# Patient Record
Sex: Female | Born: 1990 | Race: Black or African American | Hispanic: No | Marital: Single | Smoking: Never smoker
Health system: Southern US, Community
[De-identification: ages and names within clinical notes are randomized; demographics above are authoritative.]

## PROBLEM LIST (undated history)

## (undated) DIAGNOSIS — J302 Other seasonal allergic rhinitis: Secondary | ICD-10-CM

## (undated) DIAGNOSIS — K219 Gastro-esophageal reflux disease without esophagitis: Secondary | ICD-10-CM

## (undated) HISTORY — PX: WISDOM TOOTH EXTRACTION: SHX21

## (undated) HISTORY — PX: APPENDECTOMY: SHX54

---

## 2011-02-27 ENCOUNTER — Ambulatory Visit (HOSPITAL_COMMUNITY)
Admission: EM | Admit: 2011-02-27 | Discharge: 2011-03-01 | Disposition: A | Discharge status: Home / Self Care | Payer: BC Managed Care – PPO | Attending: Surgery | Admitting: Surgery

## 2011-02-27 ENCOUNTER — Emergency Department (HOSPITAL_COMMUNITY): Payer: BC Managed Care – PPO

## 2011-02-27 DIAGNOSIS — L910 Hypertrophic scar: Secondary | ICD-10-CM | POA: Insufficient documentation

## 2011-02-27 DIAGNOSIS — Z01812 Encounter for preprocedural laboratory examination: Secondary | ICD-10-CM | POA: Insufficient documentation

## 2011-02-27 DIAGNOSIS — R112 Nausea with vomiting, unspecified: Secondary | ICD-10-CM | POA: Insufficient documentation

## 2011-02-27 DIAGNOSIS — R109 Unspecified abdominal pain: Secondary | ICD-10-CM | POA: Insufficient documentation

## 2011-02-27 DIAGNOSIS — K3580 Unspecified acute appendicitis: Secondary | ICD-10-CM | POA: Insufficient documentation

## 2011-02-27 DIAGNOSIS — R197 Diarrhea, unspecified: Secondary | ICD-10-CM | POA: Insufficient documentation

## 2011-02-27 LAB — DIFFERENTIAL
Lymphs Abs: 1.9 10*3/uL (ref 0.7–4.0)
Monocytes Absolute: 0.9 10*3/uL (ref 0.1–1.0)
Monocytes Relative: 4 % (ref 3–12)
Neutro Abs: 17.9 10*3/uL — ABNORMAL HIGH (ref 1.7–7.7)
Neutrophils Relative %: 87 % — ABNORMAL HIGH (ref 43–77)

## 2011-02-27 LAB — POCT I-STAT, CHEM 8
BUN: 12 mg/dL (ref 6–23)
Calcium, Ion: 1.16 mmol/L (ref 1.12–1.32)
Chloride: 102 mEq/L (ref 96–112)
Creatinine, Ser: 0.8 mg/dL (ref 0.50–1.10)
Sodium: 139 mEq/L (ref 135–145)

## 2011-02-27 LAB — CBC
MCHC: 35.5 g/dL (ref 30.0–36.0)
Platelets: 300 10*3/uL (ref 150–400)
RDW: 14.2 % (ref 11.5–15.5)
WBC: 20.7 10*3/uL — ABNORMAL HIGH (ref 4.0–10.5)

## 2011-02-28 ENCOUNTER — Emergency Department (HOSPITAL_COMMUNITY): Payer: BC Managed Care – PPO

## 2011-02-28 ENCOUNTER — Other Ambulatory Visit (INDEPENDENT_AMBULATORY_CARE_PROVIDER_SITE_OTHER): Payer: Self-pay | Admitting: Surgery

## 2011-02-28 ENCOUNTER — Other Ambulatory Visit: Payer: Self-pay | Admitting: Emergency Medicine

## 2011-02-28 DIAGNOSIS — K3580 Unspecified acute appendicitis: Secondary | ICD-10-CM

## 2011-02-28 DIAGNOSIS — R1031 Right lower quadrant pain: Secondary | ICD-10-CM

## 2011-02-28 DIAGNOSIS — R112 Nausea with vomiting, unspecified: Secondary | ICD-10-CM

## 2011-02-28 LAB — HEPATIC FUNCTION PANEL
Albumin: 4.3 g/dL (ref 3.5–5.2)
Total Protein: 8.1 g/dL (ref 6.0–8.3)

## 2011-02-28 LAB — POCT PREGNANCY, URINE: Preg Test, Ur: NEGATIVE

## 2011-02-28 LAB — RAPID URINE DRUG SCREEN, HOSP PERFORMED
Amphetamines: NOT DETECTED
Cocaine: NOT DETECTED
Opiates: NOT DETECTED
Tetrahydrocannabinol: NOT DETECTED

## 2011-02-28 LAB — WET PREP, GENITAL
Trich, Wet Prep: NONE SEEN
WBC, Wet Prep HPF POC: NONE SEEN
Yeast Wet Prep HPF POC: NONE SEEN

## 2011-02-28 LAB — URINALYSIS, ROUTINE W REFLEX MICROSCOPIC
Leukocytes, UA: NEGATIVE
Nitrite: NEGATIVE
Specific Gravity, Urine: 1.017 (ref 1.005–1.030)
Urobilinogen, UA: 0.2 mg/dL (ref 0.0–1.0)
pH: 6 (ref 5.0–8.0)

## 2011-02-28 LAB — LIPASE, BLOOD: Lipase: 27 U/L (ref 11–59)

## 2011-02-28 LAB — GC/CHLAMYDIA PROBE AMP, GENITAL: Chlamydia, DNA Probe: NEGATIVE

## 2011-02-28 LAB — URINE MICROSCOPIC-ADD ON

## 2011-02-28 MED ORDER — IOHEXOL 300 MG/ML  SOLN
100.0000 mL | Freq: Once | INTRAMUSCULAR | Status: AC | PRN
  Administered 2011-02-28: 100 mL via INTRAVENOUS

## 2011-02-28 NOTE — H&P (Signed)
NAME:  Kim Odom, Kim Odom NO.:  [id number]  MEDICAL RECORD NO.:  [id number]  LOCATION:  5153                         FACILITY:  [facility]  PHYSICIAN:  Almond Lint, MD       DATE OF BIRTH:  [birth year]  DATE OF ADMISSION:  02/27/2011 DATE OF DISCHARGE:                             HISTORY & PHYSICAL   CHIEF COMPLAINT:  Abdominal pain.  HISTORY OF PRESENT ILLNESS:  Kim Odom is a 20 year old female who has had several days of nausea, vomiting and diarrhea.  She had taken some laxatives that seemed to precipitate this.  She developed abdominal pain starting yesterday.  This occurred mainly in the suprapubic region. She also has had a small amount of vaginal bleeding when she wipes.  It is not the time for her period.  She denies anorexia.  She has not had fevers, but does feel very cold.  The pain is worse with movement.  It was around 10/10 when she came to the emergency department and after receiving IV pain medication and has diminished.  PAST MEDICAL HISTORY:  Significant for no chronic illnesses.  PAST SURGICAL HISTORY:  Negative.  FAMILY HISTORY:  Noncontributory.  SOCIAL HISTORY:  She does not take smoke, drink alcohol or use drugs. She is a Theatre stage manager at [employer].  REVIEW OF SYSTEMS:  Positive per the HPI and past medical history, otherwise negative.  MEDICATIONS:  None.  ALLERGIES:  None.  PHYSICAL EXAMINATION:  VITAL SIGNS:  Temperature 97.4, heart rate 63, blood pressure 114/62, respiratory rate 16, sats 99%. GENERAL:  Alert and oriented x3, in no acute distress.  She looks tired, but does not appear to be in pain. HEENT:  Head is normocephalic, atraumatic. PSYCH:  Mood and affect are normal. NECK:  Supple.  No lymphadenopathy.  No thyromegaly.  Trachea is midline. HEART:  Regular rate and rhythm.  No murmurs or rubs. LUNGS:  Clear to auscultation bilaterally. ABDOMEN:  Soft, nondistended.  Tender in the right lower quadrant and suprapubic  region.  Pressing on the left lower quadrant produces suprapubic pain. EXTREMITIES:  Warm and well perfused.  Without pitting edema.  STUDIES:  Urine tox negative.  UA shows moderate hemoglobin and many bacteria.  Lipase 27.  White count 20.7, hemoglobin 13.9, hematocrit 39, platelet count 300,000.  Sodium 139, potassium 3.4, chloride 102, CO2 25, BUN 12, creatinine 0.8, chloride 107.  Urine pregnancy is negative. LFTs are normal.  CT shows acute appendicitis with possibly a free fluid.  Ultrasound of the pelvis is negative for pelvic mass or ovarian issues.  IMPRESSION:  Kim Odom is a 20 year old female with acute appendicitis, possible perforation.  She was given IV antibiotics and IV fluid.  She will go to the operating room when there is one available for an appendectomy.  I discussed the risk of bleeding, infection, damage to other structures.  I advised her that we could look at the ovaries during surgery.  Also, I discussed a risk of abscess and the possibility of being in the hospital longer if she does have a perforation.Almond Lint, MD     FB/MEDQ  D:  02/28/2011  T:  02/28/2011  Job:  [id number]  Electronically Signed  by Almond Lint MD on 02/28/2011 12:35:54 PM

## 2011-03-01 NOTE — Op Note (Signed)
NAME:  MAYMIE, BRUNKE NO.:  [id number]  MEDICAL RECORD NO.:  [id number]  LOCATION:  5153                         FACILITY:  [facility]  PHYSICIAN:  Wilmon Arms. Corliss Skains, M.D. DATE OF BIRTH:  January 29, 1991  DATE OF PROCEDURE:  02/28/2011 DATE OF DISCHARGE:                              OPERATIVE REPORT   PREOPERATIVE DIAGNOSIS:  Acute appendicitis.  POSTOPERATIVE DIAGNOSIS:  Acute appendicitis.  PROCEDURE:  Laparoscopic appendectomy.  SURGEON:  Wilmon Arms. Corliss Skains, MD  ANESTHESIA:  General.  INDICATIONS:  This is a 20 year old female who presents with a 2-day history of abdominal pain.  This has localized to her right lower quadrant.  She presented to the emergency department for evaluation. She had a CT scan showing signs of early acute appendicitis.  There was some free fluid around the appendix with some question of possible perforation.  She presents now for appendectomy.  She did have a pelvic ultrasound because of vaginal bleeding, but this was unremarkable.  DESCRIPTION OF PROCEDURE:  The patient was brought to the operating room and placed in supine position on the operating room table.  After an adequate level of general anesthesia was obtained, a Foley catheter was placed under sterile technique.  The patient's abdomen was prepped with ChloraPrep and draped in sterile fashion.  Time-out was taken to ensure the proper patient, proper procedure.  The patient has a keloid above her umbilicus.  We excised the keloid through an elliptical incision. Dissection was carried down to the fascia.  We opened the fascia vertically.  We entered the peritoneal cavity bluntly.  A stay suture of 0 Vicryl was placed around the fascial opening.  The Hasson cannula was inserted and secured to stay sutures.  Pneumoperitoneum was obtained by insufflating the CO2 maintaining the maximal pressure of 15 mmHg.  The laparoscope was inserted and the patient was positioned in  Trendelenburg tilted to her left.  A 5-mm port was placed in right upper quadrant and another 5-mm port was placed in left lower quadrant.  We moved the scope to the right upper quadrant port site.  We mobilized the cecum medially. There was some adhesions laterally and we incised these with the harmonic scalpel.  A moderately inflamed, but nonperforated appendix was identified.  We mobilized the mesoappendix by dividing with the harmonic scalpel.  Once we had cleared the appendix all the way back to its base of cecum, we divided this with an Endo-GIA blue load stapler.  Staple line was intact with no sign of bleeding or leak.  The appendix was placed in EndoCatch sac and removed through the umbilical port site.  We irrigated the right lower quadrant and no bleeding was noted.  We examined the uterus and both ovaries and both of these appeared normal on gross examination.  Pneumoperitoneum was then released as were the trocars.  The umbilical fascia was closed with a pursestring suture.  4-0 Monocryl was used to close all the skin incisions.  Steri-Strips and clean dressings were applied.  The patient was extubated and brought to recovery in stable condition.  All sponge, instrument, needle counts were correct.     Wilmon Arms. Corliss Skains, M.D.     MKT/MEDQ  D:  02/28/2011  T:  02/28/2011  Job:  [id number]  Electronically Signed by Manus Rudd M.D. on 03/01/2011 09:46:55 PM

## 2011-03-05 ENCOUNTER — Inpatient Hospital Stay (INDEPENDENT_AMBULATORY_CARE_PROVIDER_SITE_OTHER)
Admission: RE | Admit: 2011-03-05 | Discharge: 2011-03-05 | Disposition: A | Discharge status: Home / Self Care | Payer: BC Managed Care – PPO | Source: Ambulatory Visit | Attending: Family Medicine | Admitting: Family Medicine

## 2011-03-05 DIAGNOSIS — L989 Disorder of the skin and subcutaneous tissue, unspecified: Secondary | ICD-10-CM

## 2011-03-14 ENCOUNTER — Encounter (INDEPENDENT_AMBULATORY_CARE_PROVIDER_SITE_OTHER): Payer: BC Managed Care – PPO | Admitting: General Surgery

## 2011-03-27 ENCOUNTER — Encounter (INDEPENDENT_AMBULATORY_CARE_PROVIDER_SITE_OTHER): Payer: Self-pay | Admitting: Surgery

## 2011-03-29 ENCOUNTER — Encounter (INDEPENDENT_AMBULATORY_CARE_PROVIDER_SITE_OTHER): Payer: Self-pay | Admitting: Surgery

## 2011-03-30 ENCOUNTER — Ambulatory Visit (INDEPENDENT_AMBULATORY_CARE_PROVIDER_SITE_OTHER): Payer: BC Managed Care – PPO | Admitting: Surgery

## 2011-03-30 ENCOUNTER — Encounter (INDEPENDENT_AMBULATORY_CARE_PROVIDER_SITE_OTHER): Payer: Self-pay | Admitting: Surgery

## 2011-03-30 VITALS — BP 126/84 | HR 80 | Temp 97.6°F | Resp 16 | Ht 64.0 in | Wt 161.6 lb

## 2011-03-30 DIAGNOSIS — K3580 Unspecified acute appendicitis: Secondary | ICD-10-CM | POA: Insufficient documentation

## 2011-03-30 DIAGNOSIS — Z9049 Acquired absence of other specified parts of digestive tract: Secondary | ICD-10-CM

## 2011-03-30 DIAGNOSIS — Z9889 Other specified postprocedural states: Secondary | ICD-10-CM

## 2011-03-30 NOTE — Progress Notes (Signed)
Status post left Appendectomy on August 21 for early acute appendicitis. We also excised the keloid at her umbilicus. Overall the patient is doing well. Appetite balance are normal. Her umbilical site the incision is healing well. Her right upper quadrant port site seems to be rather thick and forming a small keloid. I encouraged easement derma and to massage this area. The left lower quadrant port site looks good. Followup on a p.r.n. basis. No activity restrictions.

## 2012-07-20 ENCOUNTER — Emergency Department (HOSPITAL_COMMUNITY)
Admission: EM | Admit: 2012-07-20 | Discharge: 2012-07-21 | Disposition: A | Discharge status: Home / Self Care | Attending: Emergency Medicine | Admitting: Emergency Medicine

## 2012-07-20 ENCOUNTER — Encounter (HOSPITAL_COMMUNITY): Payer: Self-pay

## 2012-07-20 DIAGNOSIS — J029 Acute pharyngitis, unspecified: Secondary | ICD-10-CM | POA: Insufficient documentation

## 2012-07-20 DIAGNOSIS — R509 Fever, unspecified: Secondary | ICD-10-CM | POA: Insufficient documentation

## 2012-07-20 DIAGNOSIS — R51 Headache: Secondary | ICD-10-CM | POA: Insufficient documentation

## 2012-07-20 DIAGNOSIS — J3489 Other specified disorders of nose and nasal sinuses: Secondary | ICD-10-CM | POA: Insufficient documentation

## 2012-07-20 DIAGNOSIS — R0982 Postnasal drip: Secondary | ICD-10-CM | POA: Insufficient documentation

## 2012-07-20 DIAGNOSIS — R61 Generalized hyperhidrosis: Secondary | ICD-10-CM | POA: Insufficient documentation

## 2012-07-20 DIAGNOSIS — M2550 Pain in unspecified joint: Secondary | ICD-10-CM | POA: Insufficient documentation

## 2012-07-20 DIAGNOSIS — R6889 Other general symptoms and signs: Secondary | ICD-10-CM

## 2012-07-20 DIAGNOSIS — R5381 Other malaise: Secondary | ICD-10-CM | POA: Insufficient documentation

## 2012-07-20 MED ORDER — ACETAMINOPHEN 325 MG PO TABS
650.0000 mg | ORAL_TABLET | Freq: Once | ORAL | Status: AC
  Administered 2012-07-20: 650 mg via ORAL
  Filled 2012-07-20: qty 2

## 2012-07-20 NOTE — ED Notes (Addendum)
Pt reports all over body aches and fever x1 day. Pt denies N/V/D or cough. Pt reports taking Robitussin last night, denies taking anything for her fever

## 2012-07-20 NOTE — ED Provider Notes (Signed)
History     CSN: [id number]  Arrival date & time 07/20/12  2236   First MD Initiated Contact with Patient 07/20/12 2339      Chief Complaint  Patient presents with  . Influenza    (Consider location/radiation/quality/duration/timing/severity/associated sxs/prior treatment) Patient is a 22 y.o. female presenting with flu symptoms. The history is provided by the patient. No language interpreter was used.  Influenza This is a new problem. The current episode started yesterday. The problem occurs constantly. The problem has been gradually worsening. Associated symptoms include arthralgias, chills, congestion, diaphoresis, fatigue, a fever, headaches and a sore throat. Pertinent negatives include no abdominal pain, chest pain, coughing, nausea, neck pain, rash, swollen glands or vomiting. The symptoms are aggravated by swallowing and sneezing. Treatments tried: robitussin. The treatment provided mild relief.  21yo college student with chills,  Fever, body aches and upper respiratory symptoms since she got home from the gym last pm.  She has a sore throat as well.  She took robitussin last night and benadryl.  Lives with a room mate and goes to [employer] at [employer]. Does not smoke.  No diabetes.   History reviewed. No pertinent past medical history.  Past Surgical History  Procedure Date  . Appendectomy     History reviewed. No pertinent family history.  History  Substance Use Topics  . Smoking status: Never Smoker   . Smokeless tobacco: Not on file  . Alcohol Use: No    OB History    Grav Para Term Preterm Abortions TAB SAB Ect Mult Living                  Review of Systems  Constitutional: Positive for fever, chills, diaphoresis and fatigue.  HENT: Positive for congestion, sore throat, rhinorrhea, sneezing, postnasal drip and sinus pressure. Negative for ear pain, neck pain and neck stiffness.   Respiratory: Negative for cough, shortness of breath and wheezing.     Cardiovascular: Negative for chest pain.  Gastrointestinal: Negative for nausea, vomiting and abdominal pain.  Musculoskeletal: Positive for arthralgias.  Skin: Negative for rash.  Neurological: Positive for headaches.    Allergies  Review of patient's allergies indicates no known allergies.  Home Medications   Current Outpatient Rx  Name  Route  Sig  Dispense  Refill  . DIPHENHYDRAMINE HCL 25 MG PO CAPS   Oral   Take 25 mg by mouth every 6 (six) hours as needed. For allergy         . GUAIFENESIN 100 MG/5ML PO SOLN   Oral   Take 10 mLs by mouth every 4 (four) hours as needed. For cough           BP 124/82  Pulse 135  Temp 102.7 F (39.3 C) (Oral)  Resp 18  SpO2 99%  LMP 07/20/2012  Physical Exam  Nursing note and vitals reviewed. Constitutional: She is oriented to person, place, and time. She appears well-developed and well-nourished.  HENT:  Head: Normocephalic and atraumatic.  Eyes: Conjunctivae normal and EOM are normal. Pupils are equal, round, and reactive to light.  Neck: Normal range of motion. Neck supple.  Cardiovascular: Normal rate.        ST initially with fever 102.7  Pulmonary/Chest: Effort normal and breath sounds normal. No respiratory distress. She has no wheezes.  Abdominal: Soft. Bowel sounds are normal. She exhibits no distension.  Musculoskeletal: Normal range of motion. She exhibits tenderness. She exhibits no edema.  General body aches  Neurological: She is alert and oriented to person, place, and time. She has normal reflexes.  Skin: Skin is warm and dry.  Psychiatric: She has a normal mood and affect.    ED Course  Procedures (including critical care time)  Labs Reviewed - No data to display No results found.   No diagnosis found.    MDM  Flu-like symptoms with fever.  - strep.  Instructed to use over the counter meds, Especially ibuprofen and tylenol for fever and body aches. Tylenol in ER brought fever and HR down.  Discussed worsening symptoms to return to the ER for.  She will follow up [facility] the [facility]irm[facility] at school this week.         Remi Haggard, NP 07/21/12 1154

## 2012-07-21 LAB — RAPID STREP SCREEN (MED CTR MEBANE ONLY): Streptococcus, Group A Screen (Direct): NEGATIVE

## 2012-07-26 NOTE — ED Provider Notes (Signed)
Medical screening examination/treatment/procedure(s) were performed by non-physician practitioner and as supervising physician I was immediately available for consultation/collaboration.   Hurman Horn, MD 07/26/12 2116

## 2013-03-26 ENCOUNTER — Encounter (HOSPITAL_COMMUNITY): Payer: Self-pay | Admitting: Emergency Medicine

## 2013-03-26 ENCOUNTER — Emergency Department (INDEPENDENT_AMBULATORY_CARE_PROVIDER_SITE_OTHER)
Admission: EM | Admit: 2013-03-26 | Discharge: 2013-03-26 | Disposition: A | Discharge status: Home / Self Care | Source: Home / Self Care | Attending: Emergency Medicine | Admitting: Emergency Medicine

## 2013-03-26 DIAGNOSIS — S161XXA Strain of muscle, fascia and tendon at neck level, initial encounter: Secondary | ICD-10-CM

## 2013-03-26 DIAGNOSIS — S139XXA Sprain of joints and ligaments of unspecified parts of neck, initial encounter: Secondary | ICD-10-CM

## 2013-03-26 MED ORDER — TRAMADOL HCL 50 MG PO TABS: 100.0000 mg | ORAL_TABLET | Freq: Three times a day (TID) | ORAL | Status: DC | PRN

## 2013-03-26 MED ORDER — METHOCARBAMOL 500 MG PO TABS: 500.0000 mg | ORAL_TABLET | Freq: Three times a day (TID) | ORAL | Status: DC

## 2013-03-26 MED ORDER — MELOXICAM 15 MG PO TABS: 15.0000 mg | ORAL_TABLET | Freq: Every day | ORAL | Status: DC

## 2013-03-26 NOTE — ED Provider Notes (Signed)
Chief Complaint:   Chief Complaint  Patient presents with  . 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 She denies any injury. This does not radiate down her arm. There is no numbness, tingling, weakness in the arm. Pain is worse with neck movement. She denies any swelling, adenopathy, or mass. She does not have a headache, sore throat, or stiff neck. She denies any shortness of breath or chest pain.  Review of Systems:  Other than noted above, the patient denies any of the following symptoms: Constitutional:  No fever, chills, or sweats. ENT:  No nasal congestion, sore throat, or oral ulcerations or lesions. Neck:  No swelling, or adenopathy.  Full ROM without pain. Cardiac:  No chest pain, tightness, or pressure. Respiratory:  No cough, wheezing, or dyspnea. M-S:  No joint pain, muscle pain, or back problems. Neuro:  No muscle weakness, numbness or paresthesias.  PMFSH:  Past medical history, family history, social history, meds, and allergies were reviewed.    Physical Exam:   Vital signs:  BP 120/72  Pulse 71  Temp(Src) 98.2 F (36.8 C) (Oral)  Resp 18  SpO2 100%  LMP 03/19/2013 General:  Alert, oriented and in no distress. Eye:  PERRL, full EOMs. ENT:  Pharynx clear, no oral lesions. Neck:  There is pain to palpation of the left trapezius ridge extending up towards the base of the skull and over the midline and on down to the upper back, just adjacent to the scapula on the left. Her neck has a full range of motion but with pain. Lungs:  No respiratory distress.  Breath sounds clear and equal bilaterally.  No wheezes, rales or rhonchi. Heart:  Regular rhythm.  No gallops, murmers, or rubs. Ext:  No upper extremity edema, pulses full.  Full ROM of joints with no joint or muscle pain to palpation. Neuro:  Alert  and oriented times 3.  No focal muscle weakness.  DTRs symmetric.  Sensation intact to light touch. Skin: Clear, warm and dry.  No rash.  Good capillary refill.  Assessment:  The encounter diagnosis was Cervical strain, initial encounter.  No evidence of cervical radiculopathy.  Plan:   1.  Meds:  The following meds were prescribed:   Discharge Medication List as of 03/26/2013 12:01 PM    START taking these medications   Details  meloxicam (MOBIC) 15 MG tablet Take 1 tablet (15 mg total) by mouth daily., Starting 03/26/2013, Until Discontinued, Normal    methocarbamol (ROBAXIN) 500 MG tablet Take 1 tablet (500 mg total) by mouth 3 (three) times daily., Starting 03/26/2013, Until Discontinued, Normal    traMADol (ULTRAM) 50 MG tablet Take 2 tablets (100 mg total) by mouth every 8 (eight) hours as needed for pain., Starting 03/26/2013, Until Discontinued, Normal        2.  Patient Education/Counseling:  The patient was given appropriate handouts, self care instructions, and instructed in symptomatic relief.  Was given exercises to do.  3.  Follow up:  The patient was told to follow up if no better in 3 to 4 days, if becoming worse in any way, and given some red flag symptoms such as worsening pain or new neurological symptoms which would prompt immediate return.  Follow up with Dr. Barnett Abu if no improvement in 2 weeks.     Dineen Kid  Lorenz Coaster, MD 03/26/13 2147

## 2013-03-26 NOTE — ED Notes (Signed)
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

## 2013-05-16 ENCOUNTER — Emergency Department (HOSPITAL_COMMUNITY)

## 2013-05-16 ENCOUNTER — Emergency Department (HOSPITAL_COMMUNITY)
Admission: EM | Admit: 2013-05-16 | Discharge: 2013-05-16 | Disposition: A | Discharge status: Home / Self Care | Attending: Emergency Medicine | Admitting: Emergency Medicine

## 2013-05-16 ENCOUNTER — Encounter (HOSPITAL_COMMUNITY): Payer: Self-pay | Admitting: Emergency Medicine

## 2013-05-16 DIAGNOSIS — M25469 Effusion, unspecified knee: Secondary | ICD-10-CM | POA: Insufficient documentation

## 2013-05-16 DIAGNOSIS — M25569 Pain in unspecified knee: Secondary | ICD-10-CM | POA: Insufficient documentation

## 2013-05-16 DIAGNOSIS — Z79899 Other long term (current) drug therapy: Secondary | ICD-10-CM | POA: Insufficient documentation

## 2013-05-16 DIAGNOSIS — M25561 Pain in right knee: Secondary | ICD-10-CM

## 2013-05-16 MED ORDER — MELOXICAM 15 MG PO TABS: 15.0000 mg | ORAL_TABLET | Freq: Every day | ORAL | Status: DC

## 2013-05-16 NOTE — ED Provider Notes (Signed)
CSN: [id number]     Arrival date & time 05/16/13  1834 History  This chart was scribed for non-physician practitioner, Emilia Beck, PA-C,working with Donnetta Hutching, MD, by Karle Plumber, ED Scribe.  This patient was seen in room TR11C/TR11C and the patient's care was started at 9:03 PM.  Chief Complaint  Patient presents with  . Knee Pain   Patient is a 22 y.o. female presenting with knee pain. The history is provided by the patient. No language interpreter was used.  Knee Pain Location:  Knee Injury: no   Knee location:  L knee and R knee  HPI Comments:  Kim Odom is a 22 y.o. female who presents to the Emergency Department complaining of bilateral knee pain onset three months. 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 She states any physical exertion makes the pain worse. She states she has been taking Robaxin that she had in the past, but has since stopped taking it.   History reviewed. No pertinent past medical history. Past Surgical History  Procedure Laterality Date  . Appendectomy     History reviewed. No pertinent family history. History  Substance Use Topics  . Smoking status: Never Smoker   . Smokeless tobacco: Not on file  . Alcohol Use: No   OB History   Grav Para Term Preterm Abortions TAB SAB Ect Mult Living                 Review of Systems  Musculoskeletal: Positive for arthralgias and joint swelling.       Bilateral knee pain  All other systems reviewed and are negative.    Allergies  Review of patient's allergies indicates no known allergies.  Home Medications   Current Outpatient Rx  Name  Route  Sig  Dispense  Refill  . diphenhydrAMINE (BENADRYL) 25 mg capsule   Oral   Take 25 mg by mouth every 6 (six) hours as needed. For allergy         . methocarbamol (ROBAXIN) 500 MG tablet   Oral   Take 1 tablet (500 mg total) by mouth 3 (three) times daily.   30 tablet   0    . Multiple Vitamin (MULTIVITAMIN WITH MINERALS) TABS tablet   Oral   Take 1 tablet by mouth daily.         . traMADol (ULTRAM) 50 MG tablet   Oral   Take 2 tablets (100 mg total) by mouth every 8 (eight) hours as needed for pain.   30 tablet   0    Triage Vitals: BP 134/87  Pulse 62  Temp(Src) 97.9 F (36.6 C) (Oral)  Resp 16  Wt 172 lb 9 oz (78.274 kg)  SpO2 100% Physical Exam  Nursing note and vitals reviewed. Constitutional: She is oriented to person, place, and time. She appears well-developed and well-nourished. No distress.  HENT:  Head: Normocephalic and atraumatic.  Eyes: Conjunctivae are normal. No scleral icterus.  Neck: Neck supple.  Cardiovascular: Normal rate and intact distal pulses.   Pulmonary/Chest: Effort normal. No stridor. No respiratory distress.  Abdominal: Normal appearance. She exhibits no distension.  Musculoskeletal:  Bilateral patellar tenderness to palpation. No obvious deformity. Full ROM.   Neurological: She is alert and oriented to person, place, and time.  Skin: Skin is warm and dry. No rash noted.  Psychiatric: She has a normal mood and affect. Her behavior is normal.    ED Course  Procedures (including critical care time) DIAGNOSTIC STUDIES: Oxygen Saturation is 100% on RA, normal by my interpretation.   COORDINATION OF CARE: 9:09 PM- Will refer to orthopedist. Will prescribe pain medication. Pt verbalizes understanding and agrees to plan.  Medications - No data to display  Labs Review Labs Reviewed - No data to display Imaging Review Dg Knee Complete 4 Views Left  05/16/2013   CLINICAL DATA:  Bilateral knee injury  EXAM: LEFT KNEE - COMPLETE 4+ VIEW  COMPARISON:  None.  FINDINGS: There is no evidence of fracture, dislocation, or joint effusion. There is no evidence of arthropathy or other focal bone abnormality. Soft tissues are unremarkable.  IMPRESSION: Negative.   Electronically Signed   By: Natasha Mead M.D.   On: 05/16/2013  20:34   Dg Knee Complete 4 Views Right  05/16/2013   CLINICAL DATA:  Bilateral knee injury  EXAM: RIGHT KNEE - COMPLETE 4+ VIEW  COMPARISON:  None.  FINDINGS: There is no evidence of fracture, dislocation, or joint effusion. There is no evidence of arthropathy or other focal bone abnormality. Soft tissues are unremarkable.  IMPRESSION: Negative.   Electronically Signed   By: Natasha Mead M.D.   On: 05/16/2013 20:34    EKG Interpretation   None       MDM   1. Bilateral knee pain     9:38 PM Xrays of bilateral knees unremarkable for acute changes. Patient will have mobic for pain and instructions to follow up with an Orthopedic surgeon. Vitals stable and patient afebrile.    I personally performed the services described in this documentation, which was scribed in my presence. The recorded information has been reviewed and is accurate.    Emilia Beck, PA-C 05/16/13 2141

## 2013-05-16 NOTE — ED Notes (Signed)
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 Pain is worse with physical exertion, riding a bike, running. Bilateral knees swell at times after physical exertion. CMS intact. Pain is described as tight.

## 2013-05-20 NOTE — ED Provider Notes (Signed)
Medical screening examination/treatment/procedure(s) were performed by non-physician practitioner and as supervising physician I was immediately available for consultation/collaboration.  EKG Interpretation   None        Donnetta Hutching, MD 05/20/13 [phone]

## 2013-10-21 ENCOUNTER — Emergency Department (HOSPITAL_COMMUNITY)

## 2013-10-21 ENCOUNTER — Emergency Department (HOSPITAL_COMMUNITY)
Admission: EM | Admit: 2013-10-21 | Discharge: 2013-10-21 | Disposition: A | Discharge status: Home / Self Care | Attending: Emergency Medicine | Admitting: Emergency Medicine

## 2013-10-21 ENCOUNTER — Encounter (HOSPITAL_COMMUNITY): Payer: Self-pay | Admitting: Emergency Medicine

## 2013-10-21 DIAGNOSIS — R079 Chest pain, unspecified: Secondary | ICD-10-CM | POA: Insufficient documentation

## 2013-10-21 DIAGNOSIS — Z79899 Other long term (current) drug therapy: Secondary | ICD-10-CM | POA: Insufficient documentation

## 2013-10-21 DIAGNOSIS — Z791 Long term (current) use of non-steroidal anti-inflammatories (NSAID): Secondary | ICD-10-CM | POA: Insufficient documentation

## 2013-10-21 DIAGNOSIS — R0602 Shortness of breath: Secondary | ICD-10-CM | POA: Insufficient documentation

## 2013-10-21 LAB — CBC
HCT: 37.5 % (ref 36.0–46.0)
HEMOGLOBIN: 13.1 g/dL (ref 12.0–15.0)
MCH: 28.2 pg (ref 26.0–34.0)
MCHC: 34.9 g/dL (ref 30.0–36.0)
MCV: 80.6 fL (ref 78.0–100.0)
Platelets: 267 10*3/uL (ref 150–400)
RBC: 4.65 MIL/uL (ref 3.87–5.11)
RDW: 13.5 % (ref 11.5–15.5)
WBC: 7 10*3/uL (ref 4.0–10.5)

## 2013-10-21 LAB — BASIC METABOLIC PANEL
BUN: 10 mg/dL (ref 6–23)
CO2: 23 meq/L (ref 19–32)
CREATININE: 0.74 mg/dL (ref 0.50–1.10)
Calcium: 8.6 mg/dL (ref 8.4–10.5)
Chloride: 103 mEq/L (ref 96–112)
GFR calc Af Amer: >90 mL/min (ref >90)
GFR calc non Af Amer: >90 mL/min (ref >90)
GLUCOSE: 93 mg/dL (ref 70–99)
Potassium: 4.1 mEq/L (ref 3.7–5.3)
SODIUM: 139 meq/L (ref 137–147)

## 2013-10-21 LAB — I-STAT TROPONIN, ED: Troponin i, poc: 0 ng/mL (ref 0.00–0.08)

## 2013-10-21 MED ORDER — GI COCKTAIL ~~LOC~~
30.0000 mL | Freq: Once | ORAL | Status: AC
  Administered 2013-10-21: 30 mL via ORAL
  Filled 2013-10-21: qty 30

## 2013-10-21 MED ORDER — PANTOPRAZOLE SODIUM 20 MG PO TBEC: 20.0000 mg | DELAYED_RELEASE_TABLET | Freq: Every day | ORAL | Status: DC

## 2013-10-21 NOTE — Discharge Instructions (Signed)
Chest Pain (Nonspecific) °It is often hard to give a specific diagnosis for the cause of chest pain. There is always a chance that your pain could be related to something serious, such as a heart attack or a blood clot in the lungs. You need to follow up with your caregiver for further evaluation. °CAUSES  °· Heartburn. °· Pneumonia or bronchitis. °· Anxiety or stress. °· Inflammation around your heart (pericarditis) or lung (pleuritis or pleurisy). °· A blood clot in the lung. °· A collapsed lung (pneumothorax). It can develop suddenly on its own (spontaneous pneumothorax) or from injury (trauma) to the chest. °· Shingles infection (herpes zoster virus). °The chest wall is composed of bones, muscles, and cartilage. Any of these can be the source of the pain. °· The bones can be bruised by injury. °· The muscles or cartilage can be strained by coughing or overwork. °· The cartilage can be affected by inflammation and become sore (costochondritis). °DIAGNOSIS  °Lab tests or other studies, such as X-rays, electrocardiography, stress testing, or cardiac imaging, may be needed to find the cause of your pain.  °TREATMENT  °· Treatment depends on what may be causing your chest pain. Treatment may include: °· Acid blockers for heartburn. °· Anti-inflammatory medicine. °· Pain medicine for inflammatory conditions. °· Antibiotics if an infection is present. °· You may be advised to change lifestyle habits. This includes stopping smoking and avoiding alcohol, caffeine, and chocolate. °· You may be advised to keep your head raised (elevated) when sleeping. This reduces the chance of acid going backward from your stomach into your esophagus. °· Most of the time, nonspecific chest pain will improve within 2 to 3 days with rest and mild pain medicine. °HOME CARE INSTRUCTIONS  °· If antibiotics were prescribed, take your antibiotics as directed. Finish them even if you start to feel better. °· For the next few days, avoid physical  activities that bring on chest pain. Continue physical activities as directed. °· Do not smoke. °· Avoid drinking alcohol. °· Only take over-the-counter or prescription medicine for pain, discomfort, or fever as directed by your caregiver. °· Follow your caregiver's suggestions for further testing if your chest pain does not go away. °· Keep any follow-up appointments you made. If you do not go to an appointment, you could develop lasting (chronic) problems with pain. If there is any problem keeping an appointment, you must call to reschedule. °SEEK MEDICAL CARE IF:  °· You think you are having problems from the medicine you are taking. Read your medicine instructions carefully. °· Your chest pain does not go away, even after treatment. °· You develop a rash with blisters on your chest. °SEEK IMMEDIATE MEDICAL CARE IF:  °· You have increased chest pain or pain that spreads to your arm, neck, jaw, back, or abdomen. °· You develop shortness of breath, an increasing cough, or you are coughing up blood. °· You have severe back or abdominal pain, feel nauseous, or vomit. °· You develop severe weakness, fainting, or chills. °· You have a fever. °THIS IS AN EMERGENCY. Do not wait to see if the pain will go away. Get medical help at once. Call your local emergency services (911 in [place] Do not drive yourself to the hospital. °MAKE SURE YOU:  °· Understand these instructions. °· Will watch your condition. °· Will get help right away if you are not doing well or get worse. °Document Released: 04/05/2005 Document Revised: 09/18/2011 Document Reviewed: 01/30/2008 °ExitCare® Patient Information ©2014 [employer]   [employer]. ° °

## 2013-10-21 NOTE — ED Provider Notes (Signed)
CSN: [id number]     Arrival date & time 10/21/13  1147 History   First MD Initiated Contact with Patient 10/21/13 1234     Chief Complaint  Patient presents with  . Chest Pain     (Consider location/radiation/quality/duration/timing/severity/associated sxs/prior Treatment) HPI 23 year old female who comes in today complaining of intermittent squeezing anterior chest pain that began last night during the night. It last seconds. It has been recurrent approximately every hour. In between she has asymptomatic. She has some shortness of breath do to pain when the episode occurs. She cannot site any inciting factors and has attempted no treatment as the pain resolves rapidly on exam. She has no history of prior symptoms like this. She denies any history of DVT or PE and is not on any estrogen is estrogen and has not been immobilized for any extended period of time including travel. She denies leg swelling or pain, fever, chills, cough, IV drug use, abdominal pain, nausea, or vomiting. History reviewed. No pertinent past medical history. Past Surgical History  Procedure Laterality Date  . Appendectomy     History reviewed. No pertinent family history. History  Substance Use Topics  . Smoking status: Never Smoker   . Smokeless tobacco: Not on file  . Alcohol Use: No   OB History   Grav Para Term Preterm Abortions TAB SAB Ect Mult Living                 Review of Systems  All other systems reviewed and are negative.     Allergies  Review of patient's allergies indicates no known allergies.  Home Medications   Prior to Admission medications   Medication Sig Start Date End Date Taking? Authorizing Provider  Aspirin-Acetaminophen-Caffeine (PAMPRIN MAX PO) Take 1 tablet by mouth 2 (two) times daily as needed (for period cramps).   Yes Historical Provider, MD  cetirizine (ZYRTEC) 10 MG tablet Take 10 mg by mouth daily.   Yes Historical Provider, MD  clotrimazole (LOTRIMIN) 1 % cream  Apply 1 application topically daily as needed (for skin discoloration).   Yes Historical Provider, MD  meloxicam (MOBIC) 15 MG tablet Take 1 tablet (15 mg total) by mouth daily. 05/16/13  Yes Kaitlyn Szekalski, PA-C  OVER THE COUNTER MEDICATION Apply 1 drop to eye daily as needed (for allergies).   Yes Historical Provider, MD   BP 135/84  Pulse 83  Temp(Src) 98.2 F (36.8 C) (Oral)  Resp 18  Ht 5\' 4"  (1.626 m)  Wt 190 lb (86.183 kg)  BMI 32.60 kg/m2  SpO2 99%  LMP 10/20/2013 Physical Exam  Nursing note and vitals reviewed. Constitutional: She is oriented to person, place, and time. She appears well-developed and well-nourished.  HENT:  Head: Normocephalic and atraumatic.  Right Ear: External ear normal.  Left Ear: External ear normal.  Nose: Nose normal.  Mouth/Throat: Oropharynx is clear and moist.  Eyes: Conjunctivae and EOM are normal. Pupils are equal, round, and reactive to light.  Neck: Normal range of motion. Neck supple. No JVD present. No tracheal deviation present. No thyromegaly present.  Cardiovascular: Normal rate, regular rhythm, normal heart sounds and intact distal pulses.   Pulmonary/Chest: Effort normal and breath sounds normal. No respiratory distress. She has no wheezes.  Abdominal: Soft. Bowel sounds are normal. She exhibits no mass. There is no tenderness. There is no guarding.  Musculoskeletal: Normal range of motion.  Lymphadenopathy:    She has no cervical adenopathy.  Neurological: She is alert and oriented to  person, place, and time. She has normal reflexes. No cranial nerve deficit or sensory deficit. Gait normal. GCS eye subscore is 4. GCS verbal subscore is 5. GCS motor subscore is 6.  Reflex Scores:      Bicep reflexes are 2+ on the right side and 2+ on the left side.      Patellar reflexes are 2+ on the right side and 2+ on the left side. Strength is 5/5 bilateral elbow flexor/extensors, wrist extension/flexion, intrinsic hand strength  equal Bilateral hip flexion/extension 5/5, knee flexion/extension 5/5, ankle 5/5 flexion extension    Skin: Skin is warm and dry.  Psychiatric: She has a normal mood and affect. Her behavior is normal. Judgment and thought content normal.    ED Course  Procedures (including critical care time) Labs Review Labs Reviewed  CBC  BASIC METABOLIC PANEL  Rosezena SensorI-STAT TROPOININ, ED    Imaging Review Dg Chest 2 View  10/21/2013   CLINICAL DATA:  Chest pain  EXAM: CHEST  2 VIEW  COMPARISON:  None.  FINDINGS: The heart size and mediastinal contours are within normal limits. Both lungs are clear. The visualized skeletal structures are unremarkable.  IMPRESSION: No active cardiopulmonary disease.   Electronically Signed   By: Amie Portlandavid  Ormond M.D.   On: 10/21/2013 12:34     EKG Interpretation None      MDM   Final diagnoses:  Chest pain    DDX Cardiac- atypical chest pain, normal ekg, normal troponin- given patient's age and symptoms I doubt cardiac etiology pe- perc negative, no risk factors- doubt pe Lung etiology such as infection- no symptoms c.w. This Musculoskeletal- possible but nonreproducible by palpation Gi- possible esophageal etiology, plan protonix and follow up.     Hilario Quarryanielle S Abelino Tippin, MD 10/21/13 [phone]

## 2013-10-21 NOTE — ED Notes (Signed)
Pt reports onset last night of intermittent mid chest squeezing, feels sob when the pain occurs. Denies n/v or recent cough. ekg done at triage.

## 2014-08-26 ENCOUNTER — Emergency Department (HOSPITAL_COMMUNITY)

## 2014-08-26 ENCOUNTER — Emergency Department (HOSPITAL_COMMUNITY)
Admission: EM | Admit: 2014-08-26 | Discharge: 2014-08-26 | Disposition: A | Discharge status: Home / Self Care | Attending: Emergency Medicine | Admitting: Emergency Medicine

## 2014-08-26 ENCOUNTER — Encounter (HOSPITAL_COMMUNITY): Payer: Self-pay | Admitting: Emergency Medicine

## 2014-08-26 DIAGNOSIS — Y9339 Activity, other involving climbing, rappelling and jumping off: Secondary | ICD-10-CM | POA: Insufficient documentation

## 2014-08-26 DIAGNOSIS — M2391 Unspecified internal derangement of right knee: Secondary | ICD-10-CM

## 2014-08-26 DIAGNOSIS — W19XXXA Unspecified fall, initial encounter: Secondary | ICD-10-CM

## 2014-08-26 DIAGNOSIS — Z791 Long term (current) use of non-steroidal anti-inflammatories (NSAID): Secondary | ICD-10-CM | POA: Insufficient documentation

## 2014-08-26 DIAGNOSIS — W1839XA Other fall on same level, initial encounter: Secondary | ICD-10-CM | POA: Diagnosis not present

## 2014-08-26 DIAGNOSIS — Y92838 Other recreation area as the place of occurrence of the external cause: Secondary | ICD-10-CM | POA: Diagnosis not present

## 2014-08-26 DIAGNOSIS — Z79899 Other long term (current) drug therapy: Secondary | ICD-10-CM | POA: Insufficient documentation

## 2014-08-26 DIAGNOSIS — S8991XA Unspecified injury of right lower leg, initial encounter: Secondary | ICD-10-CM | POA: Insufficient documentation

## 2014-08-26 DIAGNOSIS — Y998 Other external cause status: Secondary | ICD-10-CM | POA: Insufficient documentation

## 2014-08-26 MED ORDER — HYDROCODONE-ACETAMINOPHEN 5-325 MG PO TABS: 1.0000 | ORAL_TABLET | Freq: Four times a day (QID) | ORAL | Status: DC | PRN

## 2014-08-26 MED ORDER — IBUPROFEN 800 MG PO TABS: 800.0000 mg | ORAL_TABLET | Freq: Three times a day (TID) | ORAL | Status: DC | PRN

## 2014-08-26 NOTE — ED Notes (Signed)
Bed: WA21 Expected date:  Expected time:  Means of arrival:  Comments: Hall D 

## 2014-08-26 NOTE — Discharge Instructions (Signed)
Return here as needed. Follow up with the orthopedist provided. Ice and elevate the knee. °

## 2014-08-26 NOTE — ED Notes (Signed)
Ortho called 

## 2014-08-26 NOTE — ED Provider Notes (Signed)
CSN: [id number]     Arrival date & time 08/26/14  1621 History   First MD Initiated Contact with Patient 08/26/14 1707     Chief Complaint  Patient presents with  . Knee Pain     (Consider location/radiation/quality/duration/timing/severity/associated sxs/prior Treatment) HPI   Patient with history of knee problems (bilateral patellar tendinitis) presents today after sustaining a fall in her exercise class. She was jumping from left to right and when she landed she felt and heard a pop in her right knee and then unable to bear weight on the affected side.  She arrived to the ED via EMS.  History reviewed. No pertinent past medical history. Past Surgical History  Procedure Laterality Date  . Appendectomy     No family history on file. History  Substance Use Topics  . Smoking status: Never Smoker   . Smokeless tobacco: Not on file  . Alcohol Use: No   OB History    No data available     Review of Systems  All other systems negative except as documented in the HPI. All pertinent positives and negatives as reviewed in the HPI.  Allergies  Review of patient's allergies indicates no known allergies.  Home Medications   Prior to Admission medications   Medication Sig Start Date End Date Taking? Authorizing Provider  Aspirin-Acetaminophen-Caffeine (PAMPRIN MAX PO) Take 1 tablet by mouth 2 (two) times daily as needed (for period cramps).   Yes Historical Provider, MD  cetirizine (ZYRTEC) 10 MG tablet Take 10 mg by mouth daily as needed for allergies.    Yes Historical Provider, MD  Multiple Vitamin (MULTIVITAMIN WITH MINERALS) TABS tablet Take 1 tablet by mouth daily.   Yes Historical Provider, MD  tetrahydrozoline-zinc (VISINE-AC) 0.05-0.25 % ophthalmic solution Place 2 drops into both eyes 3 (three) times daily as needed (itchy eyes).   Yes Historical Provider, MD  meloxicam (MOBIC) 15 MG tablet Take 1 tablet (15 mg total) by mouth daily. Patient not taking: Reported on  08/26/2014 05/16/13   Emilia Beck, PA-C  pantoprazole (PROTONIX) 20 MG tablet Take 1 tablet (20 mg total) by mouth daily. Patient not taking: Reported on 08/26/2014 10/21/13   Hilario Quarry, MD   BP 148/85 mmHg  Pulse 72  Temp(Src) 98.1 F (36.7 C) (Oral)  Resp 18  SpO2 97% Physical Exam  Constitutional: She is oriented to person, place, and time. She appears well-developed and well-nourished.  HENT:  Head: Normocephalic and atraumatic.  Right Ear: External ear normal.  Left Ear: External ear normal.  Eyes: Conjunctivae and EOM are normal. Pupils are equal, round, and reactive to light.  Neck: Normal range of motion. Neck supple.  Cardiovascular: Normal rate, regular rhythm and normal heart sounds.   Pulmonary/Chest: Effort normal and breath sounds normal. She has no wheezes. She has no rales.  Abdominal: Soft. Bowel sounds are normal. She exhibits no distension.  Musculoskeletal:       Right knee: She exhibits LCL laxity and MCL laxity. She exhibits normal range of motion, no swelling, no effusion, no ecchymosis, no deformity, no laceration, no erythema, normal alignment and normal patellar mobility. Tenderness found. MCL tenderness noted. No medial joint line, no lateral joint line, no LCL and no patellar tendon tenderness noted.       Left knee: Normal.  No right ACL/PCL laxity noted  Neurological: She is alert and oriented to person, place, and time.  Skin: Skin is warm, dry and intact.    ED Course  Procedures (  including critical care time)  Imaging Review Dg Knee Complete 4 Views Right  08/26/2014   CLINICAL DATA:  Right knee injury during jumping. Right knee pain and inability to bear weight. Initial encounter.  EXAM: RIGHT KNEE - COMPLETE 4+ VIEW  COMPARISON:  05/16/2013  FINDINGS: There is no evidence of fracture, dislocation, or joint effusion. There is no evidence of arthropathy or other focal bone abnormality. Soft tissues are unremarkable.  IMPRESSION: Negative.    Electronically Signed   By: Myles RosenthalJohn  Stahl M.D.   On: 08/26/2014 18:28     MDM   Final diagnoses:  None    Patient presents today after feeling a pop in her right knee after sustaining a fall in an exercise class. Physical exam significant for MCL/LCL tenderness and laxity.  Patient NWB on affected side.  Xray of right knee negative.  Patient given knee immbolizer.  Pain adequately controlled in ED.  Patient advised to follow up with orthopedics regarding further imaging.  Patient may return as needed or if symptoms worsen.     Carlyle DollyChristopher W Shriyan Arakawa, PA-C 08/26/14 2324  Suzi RootsKevin E Steinl, MD 08/29/14 [phone]

## 2014-08-26 NOTE — ED Notes (Signed)
Per EMS pt was at exercise class when she came down on both feet from jumping she heard and felt pop in right knee. Pt states that she is unable to bear weight on right leg, pt has splint on leg and was given Fentanyl in route via IV.

## 2014-08-26 NOTE — ED Notes (Signed)
AVS explained in detail, ortho at bedside. Knows to follow up with orthopedics-information given.

## 2014-09-04 ENCOUNTER — Other Ambulatory Visit: Payer: Self-pay | Admitting: Orthopedic Surgery

## 2014-09-04 DIAGNOSIS — M25561 Pain in right knee: Secondary | ICD-10-CM

## 2014-09-22 ENCOUNTER — Ambulatory Visit
Admission: RE | Admit: 2014-09-22 | Discharge: 2014-09-22 | Disposition: A | Discharge status: Home / Self Care | Source: Ambulatory Visit | Attending: Orthopedic Surgery | Admitting: Orthopedic Surgery

## 2014-09-22 DIAGNOSIS — M25561 Pain in right knee: Secondary | ICD-10-CM

## 2014-09-24 ENCOUNTER — Other Ambulatory Visit (HOSPITAL_COMMUNITY): Payer: Self-pay | Admitting: Orthopedic Surgery

## 2014-09-28 ENCOUNTER — Other Ambulatory Visit (HOSPITAL_COMMUNITY): Payer: Self-pay | Admitting: *Deleted

## 2014-09-28 NOTE — Pre-Procedure Instructions (Signed)
Kim Odom  09/28/2014   Your procedure is scheduled on:  Thursday, October 08, 2014 at 11:30 AM.   [facility] Entr[facility] "A" Admitting Office at 9:30 AM.   Call this number if you have problems the morning of surgery: [phone]               Any questions prior to day of surgery, please call [phone] between 8 & 4 PM.   Remember:   Do not eat food or drink liquids after midnight Wednesday, 10/07/14.   Take these medicines the morning of surgery with A SIP OF WATER: Pantoprazole (Protonix), Cetirizine (Zyrtec) - if needed, Hydrocodone - if needed, may use eyedrops if needed.  Stop Vitamins and NSAIDS (Ibuprofen, Aleve, etc.) 7 days prior to surgery.   Do not wear jewelry, make-up or nail polish.  Do not wear lotions, powders, or perfumes. You may wear deodorant.  Do not shave 48 hours prior to surgery.   Do not bring valuables to the hospital.  [facility]                  for any belongings or valuables.               Contacts, dentures or bridgework may not be worn into surgery.  Leave suitcase in the car. After surgery it may be brought to your room.  For patients admitted to the hospital, discharge time is determined by your                treatment team.               Patients discharged the day of surgery will not be allowed to drive home.    Special Instructions: [place] - Preparing for Surgery  Before surgery, you can play an important role.  Because skin is not sterile, your skin needs to be as free of germs as possible.  You can reduce the number of germs on you skin by washing with CHG (chlorahexidine gluconate) soap before surgery.  CHG is an antiseptic cleaner which kills germs and bonds with the skin to continue killing germs even after washing.  Please DO NOT use if you have an allergy to CHG or antibacterial soaps.  If your skin becomes reddened/irritated stop using the CHG and inform your nurse when you arrive at  Short Stay.  Do not shave (including legs and underarms) for at least 48 hours prior to the first CHG shower.  You may shave your face.  Please follow these instructions carefully:   1.  Shower with CHG Soap the night before surgery and the                                morning of Surgery.  2.  If you choose to wash your hair, wash your hair first as usual with your       normal shampoo.  3.  After you shampoo, rinse your hair and body thoroughly to remove the                      Shampoo.  4.  Use CHG as you would any other liquid soap.  You can apply chg directly       to the skin and wash gently with scrungie or a clean washcloth.  5.  Apply the CHG Soap to your  body ONLY FROM THE NECK DOWN.        Do not use on open wounds or open sores.  Avoid contact with your eyes, ears, mouth and genitals (private parts).  Wash genitals (private parts) with your normal soap.  6.  Wash thoroughly, paying special attention to the area where your surgery        will be performed.  7.  Thoroughly rinse your body with warm water from the neck down.  8.  DO NOT shower/wash with your normal soap after using and rinsing off       the CHG Soap.  9.  Pat yourself dry with a clean towel.            10.  Wear clean pajamas.            11.  Place clean sheets on your bed the night of your first shower and do not        sleep with pets.  Day of Surgery  Do not apply any lotions the morning of surgery.  Please wear clean clothes to the hospital.     Please read over the following fact sheets that you were given: Pain Booklet, Coughing and Deep Breathing and Surgical Site Infection Prevention

## 2014-09-29 ENCOUNTER — Encounter (HOSPITAL_COMMUNITY): Payer: Self-pay

## 2014-09-29 ENCOUNTER — Encounter (HOSPITAL_COMMUNITY)
Admission: RE | Admit: 2014-09-29 | Discharge: 2014-09-29 | Disposition: A | Discharge status: Home / Self Care | Source: Ambulatory Visit | Attending: Orthopedic Surgery | Admitting: Orthopedic Surgery

## 2014-09-29 DIAGNOSIS — Z01812 Encounter for preprocedural laboratory examination: Secondary | ICD-10-CM | POA: Diagnosis present

## 2014-09-29 DIAGNOSIS — X58XXXA Exposure to other specified factors, initial encounter: Secondary | ICD-10-CM | POA: Diagnosis not present

## 2014-09-29 DIAGNOSIS — S83511A Sprain of anterior cruciate ligament of right knee, initial encounter: Secondary | ICD-10-CM | POA: Diagnosis not present

## 2014-09-29 HISTORY — DX: Other seasonal allergic rhinitis: J30.2

## 2014-09-29 HISTORY — DX: Gastro-esophageal reflux disease without esophagitis: K21.9

## 2014-09-29 LAB — CBC
HEMATOCRIT: 41 % (ref 36.0–46.0)
Hemoglobin: 13.9 g/dL (ref 12.0–15.0)
MCH: 26.6 pg (ref 26.0–34.0)
MCHC: 33.9 g/dL (ref 30.0–36.0)
MCV: 78.4 fL (ref 78.0–100.0)
Platelets: 271 10*3/uL (ref 150–400)
RBC: 5.23 MIL/uL — AB (ref 3.87–5.11)
RDW: 14.2 % (ref 11.5–15.5)
WBC: 8.5 10*3/uL (ref 4.0–10.5)

## 2014-09-29 LAB — HCG, SERUM, QUALITATIVE: Preg, Serum: NEGATIVE

## 2014-09-29 NOTE — Progress Notes (Signed)
Pre-op orders not signed in EPIC. Called Dr. Diamantina Providenceean's office and spoke with Misty StanleyLisa. She will send message to Dr. August Saucerean to sign orders.

## 2014-10-07 MED ORDER — CHLORHEXIDINE GLUCONATE 4 % EX LIQD
60.0000 mL | Freq: Once | CUTANEOUS | Status: DC
  Filled 2014-10-07: qty 60

## 2014-10-07 MED ORDER — CEFAZOLIN SODIUM-DEXTROSE 2-3 GM-% IV SOLR
2.0000 g | INTRAVENOUS | Status: AC
  Administered 2014-10-08: 2 g via INTRAVENOUS
  Filled 2014-10-07: qty 50

## 2014-10-08 ENCOUNTER — Ambulatory Visit (HOSPITAL_COMMUNITY)
Admission: RE | Admit: 2014-10-08 | Discharge: 2014-10-08 | Disposition: A | Discharge status: Home / Self Care | Source: Ambulatory Visit | Attending: Orthopedic Surgery | Admitting: Orthopedic Surgery

## 2014-10-08 ENCOUNTER — Ambulatory Visit (HOSPITAL_COMMUNITY): Admitting: Certified Registered Nurse Anesthetist

## 2014-10-08 ENCOUNTER — Encounter (HOSPITAL_COMMUNITY): Payer: Self-pay | Admitting: *Deleted

## 2014-10-08 ENCOUNTER — Encounter (HOSPITAL_COMMUNITY)
Admission: RE | Disposition: A | Discharge status: Home / Self Care | Payer: Self-pay | Source: Ambulatory Visit | Attending: Orthopedic Surgery

## 2014-10-08 DIAGNOSIS — Z9049 Acquired absence of other specified parts of digestive tract: Secondary | ICD-10-CM | POA: Insufficient documentation

## 2014-10-08 DIAGNOSIS — X58XXXD Exposure to other specified factors, subsequent encounter: Secondary | ICD-10-CM | POA: Diagnosis not present

## 2014-10-08 DIAGNOSIS — S83511A Sprain of anterior cruciate ligament of right knee, initial encounter: Secondary | ICD-10-CM | POA: Diagnosis present

## 2014-10-08 DIAGNOSIS — K219 Gastro-esophageal reflux disease without esophagitis: Secondary | ICD-10-CM | POA: Diagnosis not present

## 2014-10-08 DIAGNOSIS — S83511D Sprain of anterior cruciate ligament of right knee, subsequent encounter: Secondary | ICD-10-CM | POA: Insufficient documentation

## 2014-10-08 DIAGNOSIS — Y9344 Activity, trampolining: Secondary | ICD-10-CM | POA: Insufficient documentation

## 2014-10-08 HISTORY — PX: ANTERIOR CRUCIATE LIGAMENT REPAIR: SHX115

## 2014-10-08 SURGERY — RECONSTRUCTION, KNEE, ACL, USING HAMSTRING GRAFT
Anesthesia: General | Site: Knee | Laterality: Right

## 2014-10-08 MED ORDER — 0.9 % SODIUM CHLORIDE (POUR BTL) OPTIME
TOPICAL | Status: DC | PRN
  Administered 2014-10-08: 1000 mL

## 2014-10-08 MED ORDER — LACTATED RINGERS IV SOLN
INTRAVENOUS | Status: DC
  Administered 2014-10-08: 10:00:00 via INTRAVENOUS

## 2014-10-08 MED ORDER — ONDANSETRON HCL 4 MG/2ML IJ SOLN
INTRAMUSCULAR | Status: DC | PRN
  Administered 2014-10-08: 4 mg via INTRAVENOUS

## 2014-10-08 MED ORDER — PROPOFOL 10 MG/ML IV BOLUS
INTRAVENOUS | Status: DC | PRN
  Administered 2014-10-08: 200 mg via INTRAVENOUS

## 2014-10-08 MED ORDER — CLONIDINE HCL (ANALGESIA) 100 MCG/ML EP SOLN
150.0000 ug | Freq: Once | EPIDURAL | Status: AC
  Administered 2014-10-08: 1 mL via INTRA_ARTICULAR
  Filled 2014-10-08: qty 1.5

## 2014-10-08 MED ORDER — FENTANYL CITRATE 0.05 MG/ML IJ SOLN
INTRAMUSCULAR | Status: AC
  Administered 2014-10-08: 50 ug via INTRAVENOUS
  Filled 2014-10-08: qty 2

## 2014-10-08 MED ORDER — FENTANYL CITRATE 0.05 MG/ML IJ SOLN
INTRAMUSCULAR | Status: AC
  Filled 2014-10-08: qty 5

## 2014-10-08 MED ORDER — FENTANYL CITRATE 0.05 MG/ML IJ SOLN
INTRAMUSCULAR | Status: AC
  Filled 2014-10-08: qty 2

## 2014-10-08 MED ORDER — MIDAZOLAM HCL 2 MG/2ML IJ SOLN
1.0000 mg | INTRAMUSCULAR | Status: DC | PRN
  Administered 2014-10-08: 2 mg via INTRAVENOUS
  Filled 2014-10-08: qty 2

## 2014-10-08 MED ORDER — SODIUM CHLORIDE 0.9 % IR SOLN
Status: DC | PRN
  Administered 2014-10-08 (×5): 3000 mL

## 2014-10-08 MED ORDER — BUPIVACAINE HCL (PF) 0.25 % IJ SOLN
INTRAMUSCULAR | Status: DC | PRN
  Administered 2014-10-08: 10 mL

## 2014-10-08 MED ORDER — MORPHINE SULFATE 4 MG/ML IJ SOLN
INTRAMUSCULAR | Status: DC | PRN
  Administered 2014-10-08: 8 mg via INTRAVENOUS

## 2014-10-08 MED ORDER — ASPIRIN EC 325 MG PO TBEC: 325.0000 mg | DELAYED_RELEASE_TABLET | Freq: Every day | ORAL | Status: DC

## 2014-10-08 MED ORDER — BUPIVACAINE HCL (PF) 0.25 % IJ SOLN
INTRAMUSCULAR | Status: AC
  Filled 2014-10-08: qty 30

## 2014-10-08 MED ORDER — KETOROLAC TROMETHAMINE 30 MG/ML IJ SOLN
INTRAMUSCULAR | Status: AC
  Filled 2014-10-08: qty 1

## 2014-10-08 MED ORDER — FENTANYL CITRATE 0.05 MG/ML IJ SOLN
25.0000 ug | INTRAMUSCULAR | Status: DC | PRN
  Administered 2014-10-08 (×3): 50 ug via INTRAVENOUS

## 2014-10-08 MED ORDER — FENTANYL CITRATE 0.05 MG/ML IJ SOLN
INTRAMUSCULAR | Status: DC | PRN
Start: 2014-10-08 — End: 2014-10-08
  Administered 2014-10-08 (×2): 50 ug via INTRAVENOUS
  Administered 2014-10-08: 100 ug via INTRAVENOUS
  Administered 2014-10-08 (×2): 50 ug via INTRAVENOUS

## 2014-10-08 MED ORDER — STERILE WATER FOR IRRIGATION IR SOLN
Status: DC | PRN
  Administered 2014-10-08: 1000 mL

## 2014-10-08 MED ORDER — OXYCODONE HCL 5 MG PO TABS
ORAL_TABLET | ORAL | Status: AC
  Filled 2014-10-08: qty 1

## 2014-10-08 MED ORDER — LIDOCAINE HCL (CARDIAC) 20 MG/ML IV SOLN
INTRAVENOUS | Status: DC | PRN
  Administered 2014-10-08: 80 mg via INTRAVENOUS

## 2014-10-08 MED ORDER — DEXAMETHASONE SODIUM PHOSPHATE 10 MG/ML IJ SOLN
INTRAMUSCULAR | Status: DC | PRN
  Administered 2014-10-08: 10 mg via INTRAVENOUS

## 2014-10-08 MED ORDER — OXYCODONE HCL 5 MG/5ML PO SOLN: 5.0000 mg | Freq: Once | ORAL | Status: AC | PRN

## 2014-10-08 MED ORDER — PROPOFOL 10 MG/ML IV BOLUS
INTRAVENOUS | Status: AC
  Filled 2014-10-08: qty 20

## 2014-10-08 MED ORDER — ONDANSETRON HCL 4 MG/2ML IJ SOLN: 4.0000 mg | Freq: Once | INTRAMUSCULAR | Status: DC | PRN

## 2014-10-08 MED ORDER — FENTANYL CITRATE 0.05 MG/ML IJ SOLN
50.0000 ug | INTRAMUSCULAR | Status: DC | PRN
  Administered 2014-10-08: 100 ug via INTRAVENOUS
  Filled 2014-10-08: qty 2

## 2014-10-08 MED ORDER — OXYCODONE HCL 5 MG PO TABS
5.0000 mg | ORAL_TABLET | Freq: Once | ORAL | Status: AC | PRN
  Administered 2014-10-08: 5 mg via ORAL

## 2014-10-08 MED ORDER — METHOCARBAMOL 500 MG PO TABS: 500.0000 mg | ORAL_TABLET | Freq: Four times a day (QID) | ORAL | Status: DC

## 2014-10-08 MED ORDER — MORPHINE SULFATE 4 MG/ML IJ SOLN
INTRAMUSCULAR | Status: AC
  Filled 2014-10-08: qty 2

## 2014-10-08 MED ORDER — MIDAZOLAM HCL 2 MG/2ML IJ SOLN
INTRAMUSCULAR | Status: AC
  Filled 2014-10-08: qty 2

## 2014-10-08 MED ORDER — KETOROLAC TROMETHAMINE 30 MG/ML IJ SOLN
30.0000 mg | Freq: Once | INTRAMUSCULAR | Status: AC | PRN
  Administered 2014-10-08: 30 mg via INTRAVENOUS

## 2014-10-08 MED ORDER — OXYCODONE-ACETAMINOPHEN 5-325 MG PO TABS
1.0000 | ORAL_TABLET | Freq: Four times a day (QID) | ORAL | Status: DC | PRN
Start: 2014-10-08 — End: 2015-10-14

## 2014-10-08 SURGICAL SUPPLY — 87 items
ANCHOR BUTTON TIGHTROPE ACL RT (Orthopedic Implant) ×6 IMPLANT
BANDAGE ELASTIC 6 VELCRO ST LF (GAUZE/BANDAGES/DRESSINGS) ×3 IMPLANT
BANDAGE ESMARK 6X9 LF (GAUZE/BANDAGES/DRESSINGS) IMPLANT
BLADE CUDA 5.5 (BLADE) IMPLANT
BLADE CUTTER GATOR 3.5 (BLADE) ×3 IMPLANT
BLADE GREAT WHITE 4.2 (BLADE) ×2 IMPLANT
BLADE GREAT WHITE 4.2MM (BLADE) ×1
BLADE SURG 10 STRL SS (BLADE) ×3 IMPLANT
BLADE SURG 15 STRL LF DISP TIS (BLADE) ×2 IMPLANT
BLADE SURG 15 STRL SS (BLADE) ×4
BNDG ELASTIC 6X15 VLCR STRL LF (GAUZE/BANDAGES/DRESSINGS) ×3 IMPLANT
BNDG ESMARK 6X9 LF (GAUZE/BANDAGES/DRESSINGS)
BONE MATRIX DEMINERALIZED 1CC (Bone Implant) ×6 IMPLANT
BUR OVAL 6.0 (BURR) ×3 IMPLANT
CLOSURE STERI-STRIP 1/2X4 (GAUZE/BANDAGES/DRESSINGS) ×1
CLOSURE WOUND 1/2 X4 (GAUZE/BANDAGES/DRESSINGS) ×1
CLSR STERI-STRIP ANTIMIC 1/2X4 (GAUZE/BANDAGES/DRESSINGS) ×2 IMPLANT
COVER SURGICAL LIGHT HANDLE (MISCELLANEOUS) ×3 IMPLANT
CUFF TOURNIQUET SINGLE 34IN LL (TOURNIQUET CUFF) ×3 IMPLANT
CUFF TOURNIQUET SINGLE 44IN (TOURNIQUET CUFF) IMPLANT
DECANTER SPIKE VIAL GLASS SM (MISCELLANEOUS) ×3 IMPLANT
DRAPE ARTHROSCOPY W/POUCH 114 (DRAPES) ×3 IMPLANT
DRAPE INCISE IOBAN 66X45 STRL (DRAPES) ×3 IMPLANT
DRAPE U-SHAPE 47X51 STRL (DRAPES) ×3 IMPLANT
DRSG PAD ABDOMINAL 8X10 ST (GAUZE/BANDAGES/DRESSINGS) ×3 IMPLANT
DURAPREP 26ML APPLICATOR (WOUND CARE) ×6 IMPLANT
ELECT REM PT RETURN 9FT ADLT (ELECTROSURGICAL) ×3
ELECTRODE REM PT RTRN 9FT ADLT (ELECTROSURGICAL) ×1 IMPLANT
GAUZE SPONGE 4X4 12PLY STRL (GAUZE/BANDAGES/DRESSINGS) ×6 IMPLANT
GAUZE XEROFORM 1X8 LF (GAUZE/BANDAGES/DRESSINGS) ×6 IMPLANT
GLOVE BIOGEL PI IND STRL 6.5 (GLOVE) ×1 IMPLANT
GLOVE BIOGEL PI IND STRL 7.0 (GLOVE) ×1 IMPLANT
GLOVE BIOGEL PI IND STRL 8 (GLOVE) ×3 IMPLANT
GLOVE BIOGEL PI INDICATOR 6.5 (GLOVE) ×2
GLOVE BIOGEL PI INDICATOR 7.0 (GLOVE) ×2
GLOVE BIOGEL PI INDICATOR 8 (GLOVE) ×6
GLOVE ORTHO TXT STRL SZ7.5 (GLOVE) ×3 IMPLANT
GLOVE SURG ORTHO 8.0 STRL STRW (GLOVE) ×3 IMPLANT
GLOVE SURG SS PI 6.5 STRL IVOR (GLOVE) ×3 IMPLANT
GLOVE SURG SS PI 7.5 STRL IVOR (GLOVE) ×3 IMPLANT
GLOVE SURG SS PI 8.0 STRL IVOR (GLOVE) ×3 IMPLANT
GOWN SPEC L3 XXLG W/TWL (GOWN DISPOSABLE) ×3 IMPLANT
GOWN STRL REUS W/ TWL LRG LVL3 (GOWN DISPOSABLE) ×2 IMPLANT
GOWN STRL REUS W/TWL LRG LVL3 (GOWN DISPOSABLE) ×4
IMMOBILIZER KNEE 22 UNIV (SOFTGOODS) ×3 IMPLANT
KIT BASIN OR (CUSTOM PROCEDURE TRAY) ×3 IMPLANT
KIT BIOCARTILAGE DEL W/SYRINGE (KITS) ×3 IMPLANT
KIT ROOM TURNOVER OR (KITS) ×3 IMPLANT
MANIFOLD NEPTUNE II (INSTRUMENTS) ×3 IMPLANT
NEEDLE 18GX1X1/2 (RX/OR ONLY) (NEEDLE) ×3 IMPLANT
NS IRRIG 1000ML POUR BTL (IV SOLUTION) ×3 IMPLANT
PACK ARTHROSCOPY DSU (CUSTOM PROCEDURE TRAY) ×3 IMPLANT
PAD ARMBOARD 7.5X6 YLW CONV (MISCELLANEOUS) ×6 IMPLANT
PAD CAST 4YDX4 CTTN HI CHSV (CAST SUPPLIES) ×1 IMPLANT
PADDING CAST ABS 6INX4YD NS (CAST SUPPLIES) ×2
PADDING CAST ABS COTTON 6X4 NS (CAST SUPPLIES) ×1 IMPLANT
PADDING CAST COTTON 4X4 STRL (CAST SUPPLIES) ×2
PADDING CAST COTTON 6X4 STRL (CAST SUPPLIES) ×3 IMPLANT
PENCIL BUTTON HOLSTER BLD 10FT (ELECTRODE) ×3 IMPLANT
SET ARTHROSCOPY TUBING (MISCELLANEOUS) ×2
SET ARTHROSCOPY TUBING LN (MISCELLANEOUS) ×1 IMPLANT
SPONGE LAP 18X18 X RAY DECT (DISPOSABLE) ×3 IMPLANT
SPONGE LAP 4X18 X RAY DECT (DISPOSABLE) ×6 IMPLANT
SPONGE SCRUB IODOPHOR (GAUZE/BANDAGES/DRESSINGS) ×3 IMPLANT
STRIP CLOSURE SKIN 1/2X4 (GAUZE/BANDAGES/DRESSINGS) ×2 IMPLANT
SUCTION FRAZIER TIP 10 FR DISP (SUCTIONS) ×3 IMPLANT
SUT 2 FIBERLOOP 20 STRT BLUE (SUTURE) ×3
SUT ETHILON 3 0 PS 1 (SUTURE) ×6 IMPLANT
SUT FIBERWIRE #2 38 T-5 BLUE (SUTURE) ×3
SUT PROLENE 3 0 PS 2 (SUTURE) ×6 IMPLANT
SUT VIC AB 0 CT1 27 (SUTURE) ×2
SUT VIC AB 0 CT1 27XBRD ANBCTR (SUTURE) ×1 IMPLANT
SUT VIC AB 2-0 CT1 27 (SUTURE) ×2
SUT VIC AB 2-0 CT1 TAPERPNT 27 (SUTURE) ×1 IMPLANT
SUT VICRYL 0 UR6 27IN ABS (SUTURE) IMPLANT
SUTURE 2 FIBERLOOP 20 STRT BLU (SUTURE) ×1 IMPLANT
SUTURE FIBERWR #2 38 T-5 BLUE (SUTURE) ×1 IMPLANT
SUTURE TIGERSTICK 2 TIGERWIR 2 (MISCELLANEOUS) ×1 IMPLANT
SYR 30ML LL (SYRINGE) ×3 IMPLANT
SYR BULB IRRIGATION 50ML (SYRINGE) ×3 IMPLANT
SYR TB 1ML LUER SLIP (SYRINGE) ×3 IMPLANT
TIGERSTICK 2 TIGERWIRE 2 (MISCELLANEOUS) ×3
TOWEL OR 17X24 6PK STRL BLUE (TOWEL DISPOSABLE) ×3 IMPLANT
TOWEL OR 17X26 10 PK STRL BLUE (TOWEL DISPOSABLE) ×3 IMPLANT
UNDERPAD 30X30 INCONTINENT (UNDERPADS AND DIAPERS) ×3 IMPLANT
WAND HAND CNTRL MULTIVAC 90 (MISCELLANEOUS) ×3 IMPLANT
WATER STERILE IRR 1000ML POUR (IV SOLUTION) ×3 IMPLANT

## 2014-10-08 NOTE — Anesthesia Postprocedure Evaluation (Signed)
Anesthesia Post Note  Patient: Kim Odom  Procedure(s) Performed: Procedure(s) (LRB): RECONSTRUCTION ANTERIOR CRUCIATE LIGAMENT (ACL) WITH HAMSTRING GRAFT (Right)  Anesthesia type: general  Patient location: PACU  Post pain: Pain level controlled  Post assessment: Patient's Cardiovascular Status Stable  Last Vitals:  Filed Vitals:   10/08/14 1600  BP:   Pulse: 76  Temp: 36.5 C  Resp: 13    Post vital signs: Reviewed and stable  Level of consciousness: sedated  Complications: No apparent anesthesia complications

## 2014-10-08 NOTE — Brief Op Note (Signed)
10/08/2014  1:52 PM  PATIENT:  Kim Odom  24 y.o. female  PRE-OPERATIVE DIAGNOSIS:  RIGHT KNEE ANTERIOR CRUCIATE LIGAMENT TEAR  POST-OPERATIVE DIAGNOSIS:  RIGHT KNEE ANTERIOR CRUCIATE LIGAMENT TEAR  PROCEDURE:  Procedure(s): RECONSTRUCTION ANTERIOR CRUCIATE LIGAMENT (ACL) WITH HAMSTRING GRAFT  SURGEON:  Surgeon(s): Cammy CopaScott Chade Pitner, MD  ASSISTANT: carla bethune rnfa  ANESTHESIA:   general  EBL: 25 ml       BLOOD ADMINISTERED: none  DRAINS: none   LOCAL MEDICATIONS USED:  Marcaine mso4 clonidine  SPECIMEN:  No Specimen  COUNTS:  YES  TOURNIQUET:   Total Tourniquet Time Documented: Thigh (Right) - 72 minutes Total: Thigh (Right) - 72 minutes   DICTATION: .Other Dictation: Dictation Number [id number]  PLAN OF CARE: Discharge to home after PACU  PATIENT DISPOSITION:  PACU - hemodynamically stable

## 2014-10-08 NOTE — Anesthesia Procedure Notes (Signed)
Procedure Name: LMA Insertion Date/Time: 10/08/2014 11:53 AM Performed by: Adonis HousekeeperNGELL, Cinthya Bors M Pre-anesthesia Checklist: Patient identified, Emergency Drugs available, Suction available and Patient being monitored Patient Re-evaluated:Patient Re-evaluated prior to inductionOxygen Delivery Method: Circle system utilized Preoxygenation: Pre-oxygenation with 100% oxygen Intubation Type: IV induction Ventilation: Mask ventilation without difficulty LMA: LMA inserted LMA Size: 4.0 Number of attempts: 1 Placement Confirmation: positive ETCO2 and breath sounds checked- equal and bilateral Tube secured with: Tape Dental Injury: Teeth and Oropharynx as per pre-operative assessment

## 2014-10-08 NOTE — H&P (Signed)
Kim Odom is an 24 y.o. female.   Chief Complaint: Right knee instability  HPI: Kim Odom is a 24 year old female with right knee instability. She injured her right knee several weeks ago at a trampoline part. MRI scan showed anterior cruciate ligament tear with no meniscal pathology. She's had symptomatic instability since that time and presents now for operative management after explanation of risks and benefits. She denies any family history of DVT or pulmonary embolism she does report daily symptomatic instability. She has worked to achieve near normal range of motion prior to surgery.  Past Medical History  Diagnosis Date  . Seasonal allergies   . GERD (gastroesophageal reflux disease)     not on medication at this time    Past Surgical History  Procedure Laterality Date  . Appendectomy      Family History  Problem Relation Age of Onset  . Diabetes type II Mother    Social History:  reports that she has never smoked. She has never used smokeless tobacco. She reports that she drinks alcohol. She reports that she does not use illicit drugs.  Allergies: No Known Allergies  No prescriptions prior to admission    No results found for this or any previous visit (from the past 48 hour(s)). No results found.  Review of Systems  Constitutional: Negative.   HENT: Negative.   Eyes: Negative.   Respiratory: Negative.   Cardiovascular: Negative.   Gastrointestinal: Negative.   Genitourinary: Negative.   Musculoskeletal: Positive for joint pain.  Skin: Negative.   Neurological: Negative.   Endo/Heme/Allergies: Negative.   Psychiatric/Behavioral: Negative.     There were no vitals taken for this visit. Physical Exam  Constitutional: She appears well-developed.  HENT:  Head: Normocephalic.  Eyes: Pupils are equal, round, and reactive to light.  Neck: Normal range of motion.  Cardiovascular: Normal rate.   Respiratory: Effort normal.  Neurological: She is alert.   Skin: Skin is warm.  Psychiatric: She has a normal mood and affect.   examination of the right knee demonstrates near full extension lacking only about 3-4. Flexion is easily past 90 collaterals are stable anterior cruciate ligament out PCL intact no postero-lateral rotatory instability extensor mechanism is intact pedal pulses palpable ankle dorsiflexion plantar flexion is intact trace effusion present  Assessment/Plan Impression is right anterior cruciate ligament tear in an active patient plan anterior cruciate ligament reconstruction using hamstring autograft risk and benefits discussed including but not limited to infection nerve vessel damage knee stiffness the prolonged rehabilitative effort required is also discussed at length all questions answered plan for outpatient surgery with abductor canal block and postop CPM  Amedeo Detweiler SCOTT 10/08/2014, 7:44 AM

## 2014-10-08 NOTE — Transfer of Care (Signed)
Immediate Anesthesia Transfer of Care Note  Patient: Kim Odom  Procedure(s) Performed: Procedure(s): RECONSTRUCTION ANTERIOR CRUCIATE LIGAMENT (ACL) WITH HAMSTRING GRAFT (Right)  Patient Location: PACU  Anesthesia Type:General  Level of Consciousness: awake but lethargic  Airway & Oxygen Therapy: Patient Spontanous Breathing and Patient connected to nasal cannula oxygen  Post-op Assessment: Report given to RN, Post -op Vital signs reviewed and stable, Patient moving all extremities and Patient moving all extremities X 4  Post vital signs: Reviewed and stable  Last Vitals:  Filed Vitals:   10/08/14 1413  BP: 145/74  Pulse: 92  Temp:   Resp: 14    Complications: No apparent anesthesia complications

## 2014-10-08 NOTE — Anesthesia Preprocedure Evaluation (Addendum)
Anesthesia Evaluation  Patient identified by MRN, date of birth, ID band Patient awake    Reviewed: Allergy & Precautions, NPO status , Patient's Chart, lab work & pertinent test results  Airway Mallampati: II  TM Distance: >3 FB Neck ROM: Full    Dental  (+) Teeth Intact, Dental Advisory Given   Pulmonary  breath sounds clear to auscultation- rhonchi        Cardiovascular Rhythm:Regular Rate:Normal     Neuro/Psych    GI/Hepatic   Endo/Other    Renal/GU      Musculoskeletal   Abdominal   Peds  Hematology   Anesthesia Other Findings   Reproductive/Obstetrics                             Anesthesia Physical Anesthesia Plan  ASA: I  Anesthesia Plan: General   Post-op Pain Management: MAC Combined w/ Regional for Post-op pain   Induction: Intravenous  Airway Management Planned: LMA  Additional Equipment:   Intra-op Plan:   Post-operative Plan:   Informed Consent: I have reviewed the patients History and Physical, chart, labs and discussed the procedure including the risks, benefits and alternatives for the proposed anesthesia with the patient or authorized representative who has indicated his/her understanding and acceptance.   Dental advisory given  Plan Discussed with: CRNA and Anesthesiologist  Anesthesia Plan Comments:        Anesthesia Quick Evaluation

## 2014-10-08 NOTE — Progress Notes (Signed)
CRNA at bedside.

## 2014-10-09 NOTE — Op Note (Signed)
NAME:  Kim Odom, Kim Odom NO.:  [id number]  MEDICAL RECORD NO.:  [id number]  LOCATION:  [facility]                         FACILITY:  [facility]  PHYSICIAN:  Burnard Bunting, M.D.    DATE OF BIRTH:  [birth year]  DATE OF PROCEDURE: DATE OF DISCHARGE:  10/08/2014                              OPERATIVE REPORT   PREOPERATIVE DIAGNOSIS:  Right knee anterior cruciate ligament tear.  POSTOPERATIVE DIAGNOSIS:  Right knee anterior cruciate ligament tear.  PROCEDURE:  Right knee anterior cruciate ligament reconstruction, hamstring autograft, 9.5 mm.  SURGEON:  Burnard Bunting, M.D.  ASSISTANT:  Patrick Jupiter, P.A.  INDICATIONS:  Kim Odom is a 24 year old female with right knee instability following ACL tear, presents now for operative management after explanation of risks and benefits.  OPERATIVE FINDINGS: 1. Examination under anesthesia.  Range of motion, about 5 degrees of     hyperextension, 140 of flexion with stability varus and valgus     stress.  PCL intact.  ACL is out, positive Lachman's, positive     pivot shift. 2. Diagnostic arthroscopy. 3. 1.  Intact patellofemoral compartment. 4. 2.  No loose bodies in medial and lateral gutter. 5. 3.  Intact medial compartment in articular cartilage and meniscus. 6. 4.  Torn ACL and intact PCL. 7. 5.  Intact lateral compartment, articular cartilage, but with a     tear of the anterior horn lateral meniscus involving 10% to 15%     anterior-posterior width of the meniscus, more degenerative and     fraying in nature.  PROCEDURE IN DETAIL:  The patient was brought to the operating room where general anesthetic was induced.  Preop antibiotics administered. Time-out was called.  Right leg was prescrubbed with alcohol and bedtime and allowed to air dry.  Prepped with DuraPrep solution, draped in sterile manner.  Leg elevated and exsanguinated with Esmarch wrap. Tourniquet inflated.  Kim Odom was used to cover the operative  field. Tourniquet time 82 minutes at 300 mmHg.  An incision was made over the pes bursa tendon.  Skin and subcutaneous tissue were sharply divided. The semitendinosus tendon was then harvested with care being taken to avoid injury to the saphenous nerve.  This is a large tendon.  It was prepared on the back table by Patrick Jupiter, 9.5 mm using dual Endobutton technique, concurrent with graft preparation.  Diagnostic arthroscopy was performed.  Medial compartment intact.  Patellofemoral compartment intact.  ACL torn.  Notchplasty performed.  ACL stump removed.  Over-the-top position identified.  The patient had anterior horn lateral meniscal tear which was debrided back to a stable rim using a combination of basket punch and shaver.  The tear involved about 15% anterior-posterior width of the meniscus.  Articular cartilage on the lateral side was intact.  Following notchplasty, FlipCutter was placed in the 9 o'clock position on the lateral femoral condyle, drilled inside out.  Tibial tunnel then drilled inside out with 9.5 mm FlipCutter, graft passed and after StimuBlast placed in both femoral and tibial tunnels.  Graft tightened in extension, taken through range of motion, found to have excellent stability, good graft stability with negative Lachman's, negative pivot shift.  At this time, a thorough irrigation was  performed in the joint.  Tourniquet was released.  Instruments were removed.  Portals closed using combination of 3-0 Vicryl and 3-0 nylon. Harvest incision closed using interrupted inverted 2-0 Vicryl and 3-0 Prolene.  Solution of Marcaine, morphine, clonidine injected into the knee postop.  The patient was then placed in a bulky dressing, knee immobilizer.  Tolerated the procedure well without immediate complications.  Transferred to recovery room in stable condition.     Burnard BuntingG. Scott Elyce Zollinger, M.D.     GSD/MEDQ  D:  10/08/2014  T:  10/09/2014  Job:  [id number]

## 2014-10-12 ENCOUNTER — Encounter (HOSPITAL_COMMUNITY): Payer: Self-pay | Admitting: Orthopedic Surgery

## 2015-09-13 ENCOUNTER — Other Ambulatory Visit: Payer: Self-pay | Admitting: Orthopedic Surgery

## 2015-09-13 DIAGNOSIS — M25561 Pain in right knee: Secondary | ICD-10-CM

## 2015-09-19 ENCOUNTER — Ambulatory Visit
Admission: RE | Admit: 2015-09-19 | Discharge: 2015-09-19 | Disposition: A | Discharge status: Home / Self Care | Source: Ambulatory Visit | Attending: Orthopedic Surgery | Admitting: Orthopedic Surgery

## 2015-09-19 DIAGNOSIS — M25561 Pain in right knee: Secondary | ICD-10-CM

## 2015-10-06 ENCOUNTER — Other Ambulatory Visit: Payer: Self-pay | Admitting: Orthopedic Surgery

## 2015-10-08 NOTE — Pre-Procedure Instructions (Signed)
Pauletta BrownsCarlotta Houchen  10/08/2015     Your procedure is scheduled on : Thursday October 14, 2015 at 11:30 AM.  [facility] AM.  Call this number if you have problems the morning of surgery: [phone]    Remember:  Do not eat food or drink liquids after midnight.  Take these medicines the morning of surgery with A SIP OF WATER : Cetirizine (Zyrtec) if needed, Oxycodone if needed, Visine eye drops if needed   Stop taking any vitamins, herbal medications/supplements, NSAIDs, Ibuprofen, Advil, Motrin, Aleve, etc   Do not wear jewelry, make-up or nail polish.  Do not wear lotions, powders, or perfumes.   Do not shave 48 hours prior to surgery.    Do not bring valuables to the hospital.  [facility] is not responsible for any belongings or valuables.  Contacts, dentures or bridgework may not be worn into surgery.  Leave your suitcase in the car.  After surgery it may be brought to your room.  For patients admitted to the hospital, discharge time will be determined by your treatment team.  Patients discharged the day of surgery will not be allowed to drive home.   Name and phone number of your driver:    Special instructions:  Shower using CHG soap the night before and the morning of your surgery  Please read over the following fact sheets that you were given. Pain Booklet, Coughing and Deep Breathing and Surgical Site Infection Prevention

## 2015-10-11 ENCOUNTER — Encounter (HOSPITAL_COMMUNITY)
Admission: RE | Admit: 2015-10-11 | Discharge: 2015-10-11 | Disposition: A | Discharge status: Home / Self Care | Source: Ambulatory Visit | Attending: Orthopedic Surgery | Admitting: Orthopedic Surgery

## 2015-10-11 ENCOUNTER — Encounter (HOSPITAL_COMMUNITY): Payer: Self-pay

## 2015-10-11 DIAGNOSIS — X58XXXA Exposure to other specified factors, initial encounter: Secondary | ICD-10-CM | POA: Diagnosis not present

## 2015-10-11 DIAGNOSIS — S83241A Other tear of medial meniscus, current injury, right knee, initial encounter: Secondary | ICD-10-CM | POA: Diagnosis present

## 2015-10-11 LAB — CBC
HEMATOCRIT: 38.5 % (ref 36.0–46.0)
HEMOGLOBIN: 13.1 g/dL (ref 12.0–15.0)
MCH: 27.8 pg (ref 26.0–34.0)
MCHC: 34 g/dL (ref 30.0–36.0)
MCV: 81.6 fL (ref 78.0–100.0)
Platelets: 294 10*3/uL (ref 150–400)
RBC: 4.72 MIL/uL (ref 3.87–5.11)
RDW: 13.7 % (ref 11.5–15.5)
WBC: 10 10*3/uL (ref 4.0–10.5)

## 2015-10-11 LAB — HCG, SERUM, QUALITATIVE: Preg, Serum: NEGATIVE

## 2015-10-14 ENCOUNTER — Ambulatory Visit (HOSPITAL_COMMUNITY)
Admission: RE | Admit: 2015-10-14 | Discharge: 2015-10-14 | Disposition: A | Discharge status: Home / Self Care | Source: Ambulatory Visit | Attending: Orthopedic Surgery | Admitting: Orthopedic Surgery

## 2015-10-14 ENCOUNTER — Ambulatory Visit (HOSPITAL_COMMUNITY): Admitting: Certified Registered"

## 2015-10-14 ENCOUNTER — Encounter (HOSPITAL_COMMUNITY)
Admission: RE | Disposition: A | Discharge status: Home / Self Care | Payer: Self-pay | Source: Ambulatory Visit | Attending: Orthopedic Surgery

## 2015-10-14 ENCOUNTER — Encounter (HOSPITAL_COMMUNITY): Payer: Self-pay | Admitting: *Deleted

## 2015-10-14 DIAGNOSIS — S83241A Other tear of medial meniscus, current injury, right knee, initial encounter: Secondary | ICD-10-CM | POA: Diagnosis not present

## 2015-10-14 DIAGNOSIS — X58XXXA Exposure to other specified factors, initial encounter: Secondary | ICD-10-CM | POA: Insufficient documentation

## 2015-10-14 HISTORY — PX: KNEE ARTHROSCOPY WITH MENISCAL REPAIR: SHX5653

## 2015-10-14 SURGERY — ARTHROSCOPY, KNEE, WITH MENISCUS REPAIR
Anesthesia: Regional | Site: Knee | Laterality: Right

## 2015-10-14 MED ORDER — MIDAZOLAM HCL 2 MG/2ML IJ SOLN
INTRAMUSCULAR | Status: AC
  Filled 2015-10-14: qty 2

## 2015-10-14 MED ORDER — LIDOCAINE HCL (CARDIAC) 20 MG/ML IV SOLN
INTRAVENOUS | Status: DC | PRN
  Administered 2015-10-14: 60 mg via INTRAVENOUS

## 2015-10-14 MED ORDER — LACTATED RINGERS IV SOLN
INTRAVENOUS | Status: DC
  Administered 2015-10-14 (×2): via INTRAVENOUS

## 2015-10-14 MED ORDER — MORPHINE SULFATE (PF) 2 MG/ML IV SOLN
INTRAVENOUS | Status: AC
  Filled 2015-10-14: qty 1

## 2015-10-14 MED ORDER — CEFAZOLIN SODIUM-DEXTROSE 2-4 GM/100ML-% IV SOLN
2.0000 g | INTRAVENOUS | Status: AC
  Administered 2015-10-14: 2 g via INTRAVENOUS
  Filled 2015-10-14: qty 100

## 2015-10-14 MED ORDER — SODIUM CHLORIDE 0.9 % IR SOLN
Status: DC | PRN
  Administered 2015-10-14 (×2): 3000 mL

## 2015-10-14 MED ORDER — CLONIDINE HCL (ANALGESIA) 100 MCG/ML EP SOLN: EPIDURAL | Status: DC | PRN

## 2015-10-14 MED ORDER — DEXAMETHASONE SODIUM PHOSPHATE 4 MG/ML IJ SOLN
INTRAMUSCULAR | Status: DC | PRN
Start: 2015-10-14 — End: 2015-10-14
  Administered 2015-10-14: 4 mg via INTRAVENOUS

## 2015-10-14 MED ORDER — ACETAMINOPHEN 160 MG/5ML PO SOLN
325.0000 mg | ORAL | Status: DC | PRN
  Filled 2015-10-14: qty 20.3

## 2015-10-14 MED ORDER — FENTANYL CITRATE (PF) 100 MCG/2ML IJ SOLN
INTRAMUSCULAR | Status: DC | PRN
  Administered 2015-10-14: 100 ug via INTRAVENOUS
  Administered 2015-10-14 (×3): 50 ug via INTRAVENOUS

## 2015-10-14 MED ORDER — OXYCODONE-ACETAMINOPHEN 5-325 MG PO TABS
1.0000 | ORAL_TABLET | Freq: Once | ORAL | Status: AC
  Administered 2015-10-14: 1 via ORAL

## 2015-10-14 MED ORDER — HYDROMORPHONE HCL 1 MG/ML IJ SOLN
INTRAMUSCULAR | Status: AC
  Administered 2015-10-14: 0.5 mg via INTRAVENOUS
  Filled 2015-10-14: qty 1

## 2015-10-14 MED ORDER — FENTANYL CITRATE (PF) 250 MCG/5ML IJ SOLN
INTRAMUSCULAR | Status: AC
  Filled 2015-10-14: qty 5

## 2015-10-14 MED ORDER — MIDAZOLAM HCL 5 MG/5ML IJ SOLN
INTRAMUSCULAR | Status: DC | PRN
  Administered 2015-10-14: 2 mg via INTRAVENOUS

## 2015-10-14 MED ORDER — OXYCODONE-ACETAMINOPHEN 5-325 MG PO TABS
ORAL_TABLET | ORAL | Status: AC
  Administered 2015-10-14: 1 via ORAL
  Filled 2015-10-14: qty 1

## 2015-10-14 MED ORDER — BUPIVACAINE-EPINEPHRINE (PF) 0.5% -1:200000 IJ SOLN
INTRAMUSCULAR | Status: DC | PRN
  Administered 2015-10-14: 20 mL via PERINEURAL

## 2015-10-14 MED ORDER — MORPHINE SULFATE (PF) 4 MG/ML IV SOLN
INTRAVENOUS | Status: AC
  Filled 2015-10-14: qty 1

## 2015-10-14 MED ORDER — ONDANSETRON HCL 4 MG/2ML IJ SOLN
INTRAMUSCULAR | Status: DC | PRN
  Administered 2015-10-14: 4 mg via INTRAVENOUS

## 2015-10-14 MED ORDER — OXYCODONE HCL 5 MG PO TABS
5.0000 mg | ORAL_TABLET | Freq: Once | ORAL | Status: AC | PRN
  Administered 2015-10-14: 5 mg via ORAL

## 2015-10-14 MED ORDER — PROPOFOL 10 MG/ML IV BOLUS
INTRAVENOUS | Status: AC
  Filled 2015-10-14: qty 20

## 2015-10-14 MED ORDER — BUPIVACAINE-EPINEPHRINE (PF) 0.5% -1:200000 IJ SOLN
INTRAMUSCULAR | Status: DC | PRN
  Administered 2015-10-14: 7 mL

## 2015-10-14 MED ORDER — CHLORHEXIDINE GLUCONATE 4 % EX LIQD: 60.0000 mL | Freq: Once | CUTANEOUS | Status: DC

## 2015-10-14 MED ORDER — CLONIDINE HCL (ANALGESIA) 100 MCG/ML EP SOLN
150.0000 ug | Freq: Once | EPIDURAL | Status: AC
  Administered 2015-10-14: 1 mL via INTRA_ARTICULAR
  Filled 2015-10-14: qty 1.5

## 2015-10-14 MED ORDER — MORPHINE SULFATE (PF) 8 MG/ML IV SOLN
INTRAVENOUS | Status: DC | PRN
  Administered 2015-10-14: 8 mg via INTRA_ARTICULAR

## 2015-10-14 MED ORDER — FENTANYL CITRATE (PF) 100 MCG/2ML IJ SOLN
INTRAMUSCULAR | Status: AC
  Filled 2015-10-14: qty 2

## 2015-10-14 MED ORDER — OXYCODONE HCL 5 MG/5ML PO SOLN: 5.0000 mg | Freq: Once | ORAL | Status: AC | PRN

## 2015-10-14 MED ORDER — PROPOFOL 10 MG/ML IV BOLUS
INTRAVENOUS | Status: DC | PRN
  Administered 2015-10-14: 40 mg via INTRAVENOUS
  Administered 2015-10-14: 160 mg via INTRAVENOUS

## 2015-10-14 MED ORDER — LIDOCAINE HCL (CARDIAC) 20 MG/ML IV SOLN
INTRAVENOUS | Status: AC
  Filled 2015-10-14: qty 5

## 2015-10-14 MED ORDER — BUPIVACAINE-EPINEPHRINE (PF) 0.5% -1:200000 IJ SOLN
INTRAMUSCULAR | Status: AC
  Filled 2015-10-14: qty 30

## 2015-10-14 MED ORDER — DIPHENHYDRAMINE HCL 12.5 MG/5ML PO LIQD
12.5000 mg | Freq: Once | ORAL | Status: AC
  Administered 2015-10-14: 12.5 mg via ORAL
  Filled 2015-10-14: qty 10

## 2015-10-14 MED ORDER — ACETAMINOPHEN 325 MG PO TABS: 325.0000 mg | ORAL_TABLET | ORAL | Status: DC | PRN

## 2015-10-14 MED ORDER — HYDROMORPHONE HCL 1 MG/ML IJ SOLN
0.2500 mg | INTRAMUSCULAR | Status: DC | PRN
  Administered 2015-10-14 (×4): 0.5 mg via INTRAVENOUS

## 2015-10-14 MED ORDER — OXYCODONE HCL 5 MG PO TABS
ORAL_TABLET | ORAL | Status: AC
  Administered 2015-10-14: 5 mg via ORAL
  Filled 2015-10-14: qty 1

## 2015-10-14 SURGICAL SUPPLY — 77 items
ALCOHOL 70% 16 OZ (MISCELLANEOUS) ×3 IMPLANT
BANDAGE ACE 6X5 VEL STRL LF (GAUZE/BANDAGES/DRESSINGS) ×6 IMPLANT
BANDAGE ELASTIC 6 VELCRO ST LF (GAUZE/BANDAGES/DRESSINGS) IMPLANT
BANDAGE ESMARK 6X9 LF (GAUZE/BANDAGES/DRESSINGS) IMPLANT
BLADE CUTTER GATOR 3.5 (BLADE) ×3 IMPLANT
BLADE GREAT WHITE 4.2 (BLADE) ×2 IMPLANT
BLADE GREAT WHITE 4.2MM (BLADE) ×1
BLADE SURG 10 STRL SS (BLADE) ×3 IMPLANT
BLADE SURG 15 STRL LF DISP TIS (BLADE) ×1 IMPLANT
BLADE SURG 15 STRL SS (BLADE) ×2
BLADE SURG ROTATE 9660 (MISCELLANEOUS) IMPLANT
BNDG ESMARK 6X9 LF (GAUZE/BANDAGES/DRESSINGS)
BUR OVAL 6.0 (BURR) IMPLANT
COVER MAYO STAND STRL (DRAPES) ×3 IMPLANT
COVER SURGICAL LIGHT HANDLE (MISCELLANEOUS) ×3 IMPLANT
CUFF TOURNIQUET SINGLE 34IN LL (TOURNIQUET CUFF) ×3 IMPLANT
CUFF TOURNIQUET SINGLE 44IN (TOURNIQUET CUFF) IMPLANT
DRAPE ARTHROSCOPY W/POUCH 114 (DRAPES) ×3 IMPLANT
DRAPE INCISE IOBAN 66X45 STRL (DRAPES) ×3 IMPLANT
DRAPE PROXIMA HALF (DRAPES) IMPLANT
DRAPE U-SHAPE 47X51 STRL (DRAPES) ×3 IMPLANT
DRSG PAD ABDOMINAL 8X10 ST (GAUZE/BANDAGES/DRESSINGS) ×3 IMPLANT
DRSG TEGADERM 4X4.75 (GAUZE/BANDAGES/DRESSINGS) ×3 IMPLANT
DURAPREP 26ML APPLICATOR (WOUND CARE) ×3 IMPLANT
ELECT REM PT RETURN 9FT ADLT (ELECTROSURGICAL) ×3
ELECTRODE REM PT RTRN 9FT ADLT (ELECTROSURGICAL) ×1 IMPLANT
GAUZE SPONGE 4X4 12PLY STRL (GAUZE/BANDAGES/DRESSINGS) IMPLANT
GAUZE XEROFORM 1X8 LF (GAUZE/BANDAGES/DRESSINGS) IMPLANT
GAUZE XEROFORM 5X9 LF (GAUZE/BANDAGES/DRESSINGS) ×3 IMPLANT
GLOVE BIOGEL PI IND STRL 6.5 (GLOVE) ×1 IMPLANT
GLOVE BIOGEL PI IND STRL 7.0 (GLOVE) ×1 IMPLANT
GLOVE BIOGEL PI IND STRL 7.5 (GLOVE) ×3 IMPLANT
GLOVE BIOGEL PI IND STRL 8 (GLOVE) ×1 IMPLANT
GLOVE BIOGEL PI INDICATOR 6.5 (GLOVE) ×2
GLOVE BIOGEL PI INDICATOR 7.0 (GLOVE) ×2
GLOVE BIOGEL PI INDICATOR 7.5 (GLOVE) ×6
GLOVE BIOGEL PI INDICATOR 8 (GLOVE) ×2
GLOVE ECLIPSE 7.0 STRL STRAW (GLOVE) ×3 IMPLANT
GLOVE SURG ORTHO 8.0 STRL STRW (GLOVE) ×3 IMPLANT
GLOVE SURG SS PI 6.5 STRL IVOR (GLOVE) ×3 IMPLANT
GOWN STRL REUS W/ TWL LRG LVL3 (GOWN DISPOSABLE) ×2 IMPLANT
GOWN STRL REUS W/ TWL XL LVL3 (GOWN DISPOSABLE) ×1 IMPLANT
GOWN STRL REUS W/TWL LRG LVL3 (GOWN DISPOSABLE) ×4
GOWN STRL REUS W/TWL XL LVL3 (GOWN DISPOSABLE) ×2
KIT BASIN OR (CUSTOM PROCEDURE TRAY) ×3 IMPLANT
KIT ROOM TURNOVER OR (KITS) ×3 IMPLANT
MANIFOLD NEPTUNE II (INSTRUMENTS) IMPLANT
NEEDLE 18GX1X1/2 (RX/OR ONLY) (NEEDLE) ×3 IMPLANT
NEEDLE HYPO 25GX1X1/2 BEV (NEEDLE) ×3 IMPLANT
NEEDLE SPNL 18GX3.5 QUINCKE PK (NEEDLE) ×3 IMPLANT
NS IRRIG 1000ML POUR BTL (IV SOLUTION) ×3 IMPLANT
PACK ARTHROSCOPY DSU (CUSTOM PROCEDURE TRAY) ×3 IMPLANT
PAD ARMBOARD 7.5X6 YLW CONV (MISCELLANEOUS) ×6 IMPLANT
PADDING CAST ABS 4INX4YD NS (CAST SUPPLIES) ×2
PADDING CAST ABS COTTON 4X4 ST (CAST SUPPLIES) ×1 IMPLANT
PADDING CAST COTTON 6X4 STRL (CAST SUPPLIES) IMPLANT
PENCIL BUTTON HOLSTER BLD 10FT (ELECTRODE) IMPLANT
SET ARTHROSCOPY TUBING (MISCELLANEOUS) ×2
SET ARTHROSCOPY TUBING LN (MISCELLANEOUS) ×1 IMPLANT
SOL PREP POV-IOD 4OZ 10% (MISCELLANEOUS) IMPLANT
SPONGE GAUZE 4X4 12PLY STER LF (GAUZE/BANDAGES/DRESSINGS) ×3 IMPLANT
SPONGE LAP 4X18 X RAY DECT (DISPOSABLE) IMPLANT
SUT ETHILON 3 0 PS 1 (SUTURE) IMPLANT
SUT FIBERWIRE #2 38 T-5 BLUE (SUTURE)
SUT MENISCAL KIT (KITS) IMPLANT
SUT VIC AB 2-0 CT1 27 (SUTURE) ×2
SUT VIC AB 2-0 CT1 TAPERPNT 27 (SUTURE) ×1 IMPLANT
SUTURE FIBERWR #2 38 T-5 BLUE (SUTURE) IMPLANT
SYR 20ML ECCENTRIC (SYRINGE) IMPLANT
SYR CONTROL 10ML LL (SYRINGE) ×3 IMPLANT
SYR TB 1ML LUER SLIP (SYRINGE) IMPLANT
TOWEL OR 17X24 6PK STRL BLUE (TOWEL DISPOSABLE) ×3 IMPLANT
TOWEL OR 17X26 10 PK STRL BLUE (TOWEL DISPOSABLE) ×3 IMPLANT
TUBE CONNECTING 12'X1/4 (SUCTIONS)
TUBE CONNECTING 12X1/4 (SUCTIONS) IMPLANT
WAND HAND CNTRL MULTIVAC 90 (MISCELLANEOUS) IMPLANT
WATER STERILE IRR 1000ML POUR (IV SOLUTION) ×3 IMPLANT

## 2015-10-14 NOTE — H&P (Signed)
Kim Odom is an 25 y.o. female.   Chief Complaint: Right knee pain HPI: Kim Odom is a 25 year old female about a year out from right knee anterior cruciate ligament reconstruction doing well from her anterior cruciate ligament reconstruction but reported injury with popping and locking. Subsequent MRI scanning demonstrated intact graft the tear the medial meniscus. This is a new finding compared to the previous MRI scan. She reports definitive mechanical symptoms and pain on the medial side presents now for operative management after expansion risk benefits no history of DVT or pulmonary embolism  Past Medical History  Diagnosis Date  . Seasonal allergies   . GERD (gastroesophageal reflux disease)     not on medication at this time    Past Surgical History  Procedure Laterality Date  . Appendectomy    . Anterior cruciate ligament repair Right 10/08/2014    Procedure: RECONSTRUCTION ANTERIOR CRUCIATE LIGAMENT (ACL) WITH HAMSTRING GRAFT;  Surgeon: Cammy Copa, MD;  Location: [facility] OR;  Service: Orthopedics;  Laterality: Right;  . Wisdom tooth extraction      Family History  Problem Relation Age of Onset  . Diabetes type II Mother    Social History:  reports that she has never smoked. She has never used smokeless tobacco. She reports that she drinks alcohol. She reports that she does not use illicit drugs.  Allergies:  Allergies  Allergen Reactions  . Other Other (See Comments)    Seasonal allergies  . Pollen Extract Other (See Comments)    Medications Prior to Admission  Medication Sig Dispense Refill  . aspirin EC 325 MG tablet Take 1 tablet (325 mg total) by mouth daily. 30 tablet 0  . cetirizine (ZYRTEC) 10 MG tablet Take 10 mg by mouth daily as needed for allergies.     . methocarbamol (ROBAXIN) 500 MG tablet Take 1 tablet (500 mg total) by mouth 4 (four) times daily. 30 tablet 0  . Multiple Vitamin (MULTIVITAMIN WITH MINERALS) TABS tablet Take 1 tablet by mouth  daily.    Marland Kitchen oxyCODONE-acetaminophen (ROXICET) 5-325 MG per tablet Take 1-2 tablets by mouth every 6 (six) hours as needed for severe pain. 60 tablet 0  . tetrahydrozoline-zinc (VISINE-AC) 0.05-0.25 % ophthalmic solution Place 2 drops into both eyes 3 (three) times daily as needed (itchy eyes).      No results found for this or any previous visit (from the past 48 hour(s)). No results found.  Review of Systems  Constitutional: Negative.   HENT: Negative.   Eyes: Negative.   Respiratory: Negative.   Cardiovascular: Negative.   Gastrointestinal: Negative.   Genitourinary: Negative.   Musculoskeletal: Positive for joint pain.  Skin: Negative.   Neurological: Negative.   Endo/Heme/Allergies: Negative.   Psychiatric/Behavioral: Negative.     Blood pressure 135/71, pulse 62, temperature 98.6 F (37 C), temperature source Oral, resp. rate 20, weight 84.369 kg (186 lb), last menstrual period 09/17/2015, SpO2 98 %. Physical Exam  Constitutional: She appears well-developed.  HENT:  Head: Normocephalic.  Eyes: Pupils are equal, round, and reactive to light.  Neck: Normal range of motion.  Cardiovascular: Normal rate.   Respiratory: Effort normal.  Neurological: She is alert.  Skin: Skin is warm.  Psychiatric: She has a normal mood and affect.   examination the right knee demonstrates stable graft medial joint line tenderness in the extensor mechanism medial joint line tenderness palpable pedal pulses   Assessment/Plan Impression is right knee medial meniscal tear stable graft plan arthroscopy debridement of the meniscus versus  meniscal repair risks and benefits discussed with patient including but limited to infection or vessel damage numbness tingling potential for re-tearing of repair if it  is attempted patient understands the  risks and benefits all questions answered  Cammy CopaEAN,GREGORY SCOTT, MD 10/14/2015, 11:58 AM

## 2015-10-14 NOTE — Op Note (Signed)
NAME:  Kim Odom, Kim Odom           ACCOUNT NO.:  [id number]  MEDICAL RECORD NO.:  [id number]  LOCATION:  [facility]                         FACILITY:  [facility]  PHYSICIAN:  Burnard BuntingG. Scott Karmon Andis, M.D.    DATE OF BIRTH:  [birth year]  DATE OF PROCEDURE: DATE OF DISCHARGE:  10/14/2015                              OPERATIVE REPORT   PREOPERATIVE DIAGNOSIS:  Right knee medial meniscal tear.  POSTOPERATIVE DIAGNOSIS:  Right knee medial meniscal tear.  PROCEDURE:  Right knee arthroscopy with partial medial meniscectomy.  SURGEON:  Burnard BuntingG. Scott Elizabet Schweppe, M.D.  ASSISTANT:  Patrick Jupiterarla Bethune, RNFA.  INDICATIONS:  [name] is a 25 year old patient with right knee pain, presents for operative management after explanation of risks and benefits.  MRI shows medial meniscal tear.  PROCEDURE IN DETAIL:  The patient was brought to the operating room where general endotracheal anesthesia was induced.  Preoperative antibiotics were administered.  Time-out was called.  Right leg was prescrubbed with alcohol and Betadine, allowed to air dry, prepped with DuraPrep solution and draped in a sterile manner.  Collier Flowersoban was used to cover the operative field.  Tourniquet was not utilized.  Portal was injected with 7 mL each of Marcaine with epinephrine.  Diagnostic arthroscopy was performed.  Examination under anesthesia demonstrated full range of motion, stable collateral and cruciate ligaments, intact extensor mechanism.  No posterolateral rotatory instability.  Diagnostic arthroscopy demonstrated no patellofemoral arthritis, no loose bodies in medial and lateral gutter.  ACL graft was intact.  Lateral compartment, articular cartilage, and meniscus was intact.  Medial compartment and articular cartilage intact.  Posterior horn of medial meniscal tear involving about 25% anterior-posterior width of the meniscus over 2 cm area.  At this time, anterior inferior-medial portal was established. Diagnostic arthroscopy was performed  with the aforementioned findings. Meniscal tear debrided back to a stable rim with combination of basket, punch and shaver.  Following debridement, thorough irrigation was performed.  Instruments were removed.  Portal was closed using Vicryl and nylon.  Solution of Marcaine, morphine, clonidine injected into the knee.  The patient tolerated the procedure well without immediate complication, transferred to the recovery room in stable condition. Weightbearing as tolerated.     Burnard BuntingG. Scott Poppi Scantling, M.D.    GSD/MEDQ  D:  10/14/2015  T:  10/14/2015  Job:  [id number]

## 2015-10-14 NOTE — Anesthesia Procedure Notes (Addendum)
Procedure Name: LMA Insertion Date/Time: 10/14/2015 12:19 PM Performed by: Arlice ColtMANESS, CARRIE B Pre-anesthesia Checklist: Patient identified, Emergency Drugs available, Suction available, Patient being monitored and Timeout performed Patient Re-evaluated:Patient Re-evaluated prior to inductionOxygen Delivery Method: Circle system utilized Preoxygenation: Pre-oxygenation with 100% oxygen Intubation Type: IV induction LMA: LMA inserted LMA Size: 4.0 Number of attempts: 1 Placement Confirmation: positive ETCO2 and breath sounds checked- equal and bilateral Tube secured with: Tape Dental Injury: Teeth and Oropharynx as per pre-operative assessment    Anesthesia Regional Block:  Adductor canal block  Pre-Anesthetic Checklist: ,, timeout performed, Correct Patient, Correct Site, Correct Laterality, Correct Procedure, Correct Position, site marked, Risks and benefits discussed,  Surgical consent,  Pre-op evaluation,  At surgeon's request and post-op pain management  Laterality: Lower and Right  Prep: chloraprep       Needles:  Injection technique: Single-shot  Needle Type: Echogenic Stimulator Needle          Additional Needles:  Procedures: ultrasound guided (picture in chart) Adductor canal block Narrative:  Injection made incrementally with aspirations every 5 mL.  Performed by: Personally  Anesthesiologist: Nahla Lukin  Additional Notes: H+P and labs reviewed, risks and benefits discussed with patient, procedure tolerated well without complications

## 2015-10-14 NOTE — Transfer of Care (Signed)
Immediate Anesthesia Transfer of Care Note  Patient: Kim Odom  Procedure(s) Performed: Procedure(s): RIGHT KNEE ARTHROSCOPY WITH DEBRIDEMENT AND PARTIAL MEDIAL MENISCECTOMY  (Right)  Patient Location: PACU  Anesthesia Type:GA combined with regional for post-op pain  Level of Consciousness: awake, alert , oriented and patient cooperative  Airway & Oxygen Therapy: Patient Spontanous Breathing  Post-op Assessment: Report given to RN, Post -op Vital signs reviewed and stable and Patient moving all extremities X 4  Post vital signs: Reviewed and stable  Last Vitals:  Filed Vitals:   10/14/15 0948 10/14/15 1352  BP: 135/71 130/72  Pulse: 62   Temp: 37 C 36.6 C  Resp: 20 18    Complications: No apparent anesthesia complications

## 2015-10-14 NOTE — Anesthesia Preprocedure Evaluation (Signed)
Anesthesia Evaluation  Patient identified by MRN, date of birth, ID band Patient awake    Reviewed: Allergy & Precautions, NPO status , Patient's Chart, lab work & pertinent test results  History of Anesthesia Complications Negative for: history of anesthetic complications  Airway Mallampati: II  TM Distance: >3 FB Neck ROM: Full    Dental  (+) Teeth Intact   Pulmonary neg pulmonary ROS,    breath sounds clear to auscultation       Cardiovascular negative cardio ROS   Rhythm:Regular     Neuro/Psych negative neurological ROS  negative psych ROS   GI/Hepatic Neg liver ROS, GERD  Controlled,  Endo/Other  negative endocrine ROS  Renal/GU negative Renal ROS     Musculoskeletal   Abdominal   Peds  Hematology negative hematology ROS (+)   Anesthesia Other Findings   Reproductive/Obstetrics                             Anesthesia Physical Anesthesia Plan  ASA: I  Anesthesia Plan: General and Regional   Post-op Pain Management:    Induction: Intravenous  Airway Management Planned: LMA  Additional Equipment: None  Intra-op Plan:   Post-operative Plan: Extubation in OR  Informed Consent: I have reviewed the patients History and Physical, chart, labs and discussed the procedure including the risks, benefits and alternatives for the proposed anesthesia with the patient or authorized representative who has indicated his/her understanding and acceptance.   Dental advisory given  Plan Discussed with: CRNA and Surgeon  Anesthesia Plan Comments:         Anesthesia Quick Evaluation

## 2015-10-14 NOTE — Brief Op Note (Signed)
10/14/2015  1:30 PM  PATIENT:  Kim Odom  25 y.o. female  PRE-OPERATIVE DIAGNOSIS:  Right knee medial meniscal tear  POST-OPERATIVE DIAGNOSIS:  Right knee medial meniscal tear  PROCEDURE:  Procedure(s): RIGHT KNEE ARTHROSCOPY WITH DEBRIDEMENT AND PARTIAL MEDIAL MENISCECTOMY   SURGEON:  Surgeon(s): Cammy CopaScott Neriyah Cercone, MD  ASSISTANT: Patrick Jupiterarla Bethune rnfa  ANESTHESIA:   general  EBL: 3 ml    Total I/O In: 1000 [I.V.:1000] Out: -   BLOOD ADMINISTERED: none  DRAINS: none   LOCAL MEDICATIONS USED:  none  SPECIMEN:  No Specimen  COUNTS:  YES  TOURNIQUET:    DICTATION: .Other Dictation: Dictation Number [id number]  PLAN OF CARE: Discharge to home after PACU  PATIENT DISPOSITION:  PACU - hemodynamically stable

## 2015-10-15 ENCOUNTER — Encounter (HOSPITAL_COMMUNITY): Payer: Self-pay | Admitting: Orthopedic Surgery

## 2015-10-15 MED FILL — Morphine Sulfate IV Soln PF 4 MG/ML: INTRAVENOUS | Qty: 2 | Status: AC

## 2015-10-17 NOTE — Anesthesia Postprocedure Evaluation (Signed)
Anesthesia Post Note  Patient: Kim Odom  Procedure(s) Performed: Procedure(s) (LRB): RIGHT KNEE ARTHROSCOPY WITH DEBRIDEMENT AND PARTIAL MEDIAL MENISCECTOMY  (Right)  Patient location during evaluation: PACU Anesthesia Type: General and Regional Level of consciousness: awake Pain management: pain level controlled Vital Signs Assessment: post-procedure vital signs reviewed and stable Respiratory status: spontaneous breathing and respiratory function stable Cardiovascular status: stable Postop Assessment: no signs of nausea or vomiting Anesthetic complications: no    Last Vitals:  Filed Vitals:   10/14/15 1445 10/14/15 1459  BP: 110/71 133/56  Pulse: 81 77  Temp: 36.6 C   Resp: 16     Last Pain:  Filed Vitals:   10/14/15 1508  PainSc: 5                  Rosemaria Inabinet

## 2016-06-28 ENCOUNTER — Ambulatory Visit (INDEPENDENT_AMBULATORY_CARE_PROVIDER_SITE_OTHER): Payer: Self-pay | Admitting: Orthopedic Surgery

## 2016-08-03 ENCOUNTER — Telehealth (INDEPENDENT_AMBULATORY_CARE_PROVIDER_SITE_OTHER): Payer: Self-pay | Admitting: *Deleted

## 2016-08-03 NOTE — Telephone Encounter (Signed)
It could be weather-related but if she wants to come in for me to look at it I think that would be okay to.

## 2016-08-03 NOTE — Telephone Encounter (Signed)
Pt stated that in the last 2 weeks she hasn't been able to walk for more than 4 hours without limping. Pt questioning if this could be weather related or something more serious. Pt requesting call back [phone].

## 2016-08-03 NOTE — Telephone Encounter (Signed)
IC pt, appt made, advised of note.

## 2016-08-03 NOTE — Telephone Encounter (Signed)
Please advise 

## 2016-08-16 ENCOUNTER — Encounter (INDEPENDENT_AMBULATORY_CARE_PROVIDER_SITE_OTHER): Payer: Self-pay | Admitting: Orthopedic Surgery

## 2016-08-16 ENCOUNTER — Ambulatory Visit (INDEPENDENT_AMBULATORY_CARE_PROVIDER_SITE_OTHER): Payer: Managed Care, Other (non HMO) | Admitting: Orthopedic Surgery

## 2016-08-16 DIAGNOSIS — G8929 Other chronic pain: Secondary | ICD-10-CM | POA: Insufficient documentation

## 2016-08-16 DIAGNOSIS — M25561 Pain in right knee: Secondary | ICD-10-CM

## 2016-08-16 DIAGNOSIS — Z87828 Personal history of other (healed) physical injury and trauma: Secondary | ICD-10-CM | POA: Diagnosis not present

## 2016-08-16 NOTE — Progress Notes (Signed)
Office Visit Note   Patient: Kim Odom           Date of Birth: 02/07/1991           MRN: [id number] Visit Date: 08/16/2016 Requested by: No referring provider defined for this encounter. PCP: PROVIDER NOT IN SYSTEM  Subjective: Chief Complaint  Patient presents with  . Right Knee - Pain, Follow-up    HPI Kim Odom is a 26 year old patient with right knee pain.  She had partial medial meniscectomy 10/14/2015 and anterior cruciate ligament reconstruction 10/09/2014.  If she stands more than 4 hours it hurts her and it is stiff and hard to bend.  Started aching last night and it was fairly bad today with the rain.  She would like to get a physical therapy referral for strengthening.  She states for the past 2 weeks she's had some popping with extension which is annoying but not necessarily painful.  She's been taking Advil and Aleve.              Review of Systems All systems reviewed are negative as they relate to the chief complaint within the history of present illness.  Patient denies  fevers or chills.    Assessment & Plan: Visit Diagnoses:  1. History of tear of ACL (anterior cruciate ligament)   2. Chronic pain of right knee     Plan: Impression is right knee pain status post anterior cruciate ligament reconstruction and partial medial meniscectomy.  No effusion today and the graft is stable.  We'll send her to physical therapy to get the leg a little stronger.  Consider an injection into the knee joint she remains symptomatic after 4-6 weeks.  I don't think there is anything more surgical in the knee.  Follow-Up Instructions: No Follow-up on file.   Orders:  No orders of the defined types were placed in this encounter.  No orders of the defined types were placed in this encounter.     Procedures: No procedures performed   Clinical Data: No additional findings.  Objective: Vital Signs: There were no vitals taken for this visit.  Physical Exam    Constitutional: Patient appears well-developed HEENT:  Head: Normocephalic Eyes:EOM are normal Neck: Normal range of motion Cardiovascular: Normal rate Pulmonary/chest: Effort normal Neurologic: Patient is alert Skin: Skin is warm Psychiatric: Patient has normal mood and affect    Ortho Exam examination of the right knee demonstrates full active and passive range of motion she does have a little bit of popping in full extension which seems to be located around the anterior aspect of the joint.  Collaterals and cruciates stable.  No other masses lymph adenopathy or skin changes noted in the knee region.  No effusion.  Specialty Comments:  No specialty comments available.  Imaging: No results found.   PMFS History: Patient Active Problem List   Diagnosis Date Noted  . History of tear of ACL (anterior cruciate ligament) 08/16/2016  . Chronic pain of right knee 08/16/2016  . Acute appendicitis 03/30/2011   Past Medical History:  Diagnosis Date  . GERD (gastroesophageal reflux disease)    not on medication at this time  . Seasonal allergies     Family History  Problem Relation Age of Onset  . Diabetes type II Mother     Past Surgical History:  Procedure Laterality Date  . ANTERIOR CRUCIATE LIGAMENT REPAIR Right 10/08/2014   Procedure: RECONSTRUCTION ANTERIOR CRUCIATE LIGAMENT (ACL) WITH HAMSTRING GRAFT;  Surgeon: Cammy Copa, MD;  Location: [facility] OR;  Service: Orthopedics;  Laterality: Right;  . APPENDECTOMY    . KNEE ARTHROSCOPY WITH MENISCAL REPAIR Right 10/14/2015   Procedure: RIGHT KNEE ARTHROSCOPY WITH DEBRIDEMENT AND PARTIAL MEDIAL MENISCECTOMY ;  Surgeon: Cammy CopaScott Amar Keenum, MD;  Location: [facility] OR;  Service: Orthopedics;  Laterality: Right;  . WISDOM TOOTH EXTRACTION     Social History   Occupational History  . Not on file.   Social History Main Topics  . Smoking status: Never Smoker  . Smokeless tobacco: Never Used  . Alcohol use Yes     Comment:  occasional  . Drug use: No  . Sexual activity: Not on file

## 2016-11-13 IMAGING — MR MR KNEE*R* W/O CM
5 of 6 series · 31 of 40 positions shown · non-contrast
Comparison: MRI 09/22/2014.

CLINICAL DATA: Right knee locking and instability over the last
month. Right knee surgery 1 year ago for ACL reconstruction. No
recent injury.

EXAM:
MRI OF THE RIGHT KNEE WITHOUT CONTRAST
TECHNIQUE: Multiplanar, multisequence MR imaging of the knee was performed. No
intravenous contrast was administered.

[Series 3: PD fat-sat · axial · 3.5mm · 0.62mm/px · z∈[-54,+39]mm · 7 of 25 slices shown (1 of 3)]
[im 1/25]
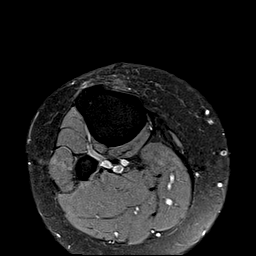
[im 5/25]
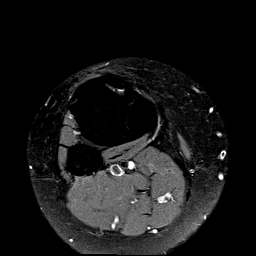
[im 9/25]
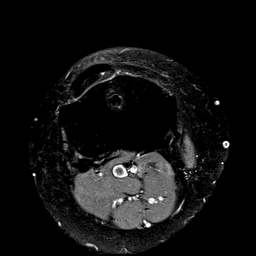
[im 13/25]
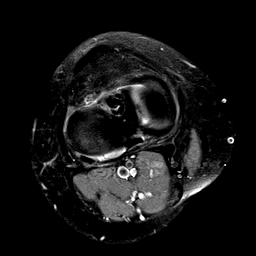
[im 17/25]
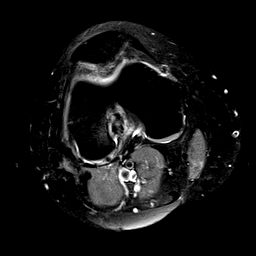
[im 21/25]
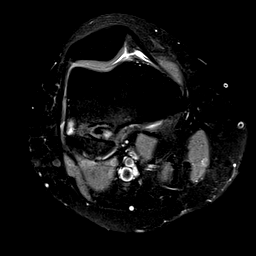
[im 25/25]
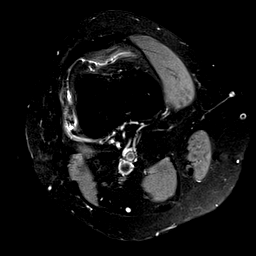

[Series 4: PD fat-sat · coronal · 4.0mm · 0.50mm/px · 8 of 24 slices shown (2 of 3)]
[im 1/24]
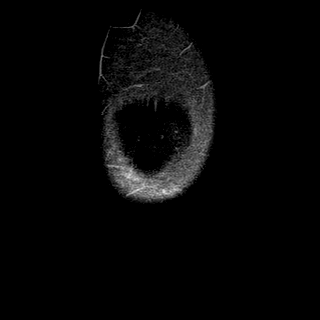
[im 4/24]
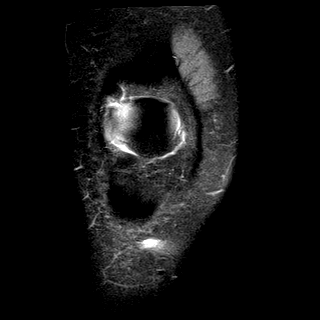
[im 7/24]
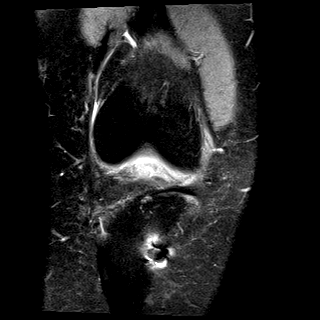
[im 10/24]
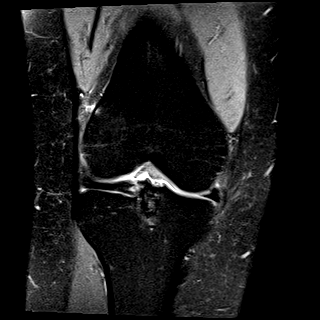
[im 14/24]
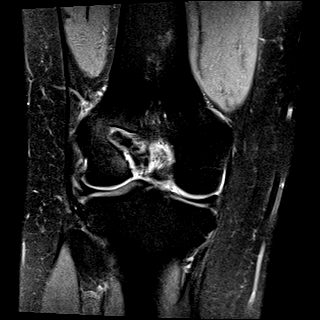
[im 17/24]
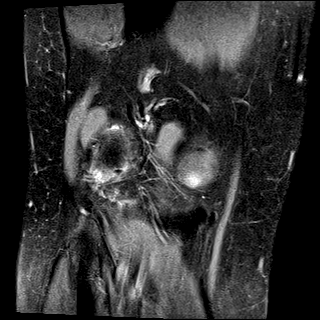
[im 20/24]
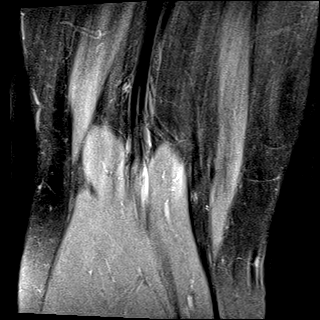
[im 24/24]
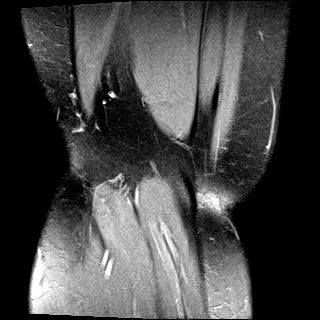

[Series 5: T2 fat-sat · coronal · 4.0mm · 0.31mm/px · 8 of 24 slices shown]
[im 1/24]
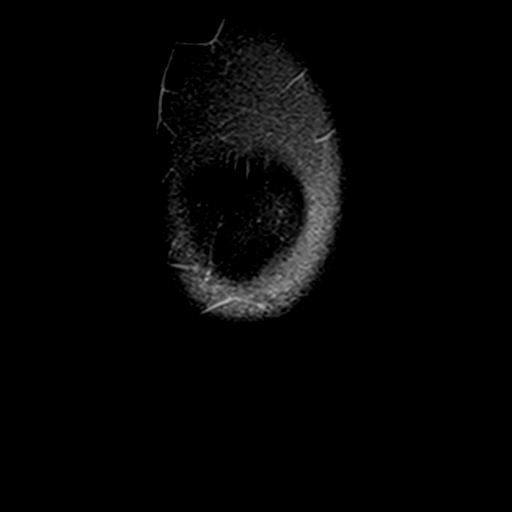
[im 4/24]
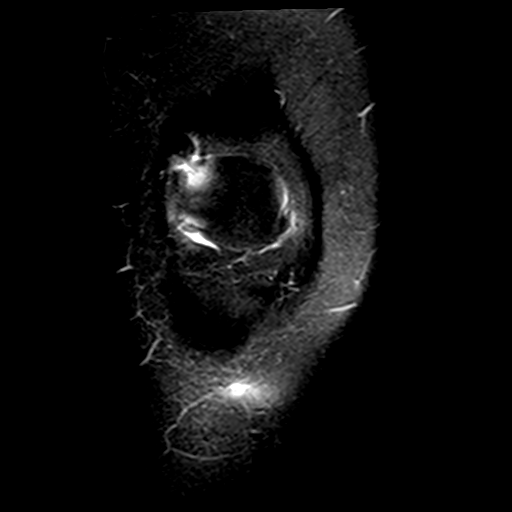
[im 7/24]
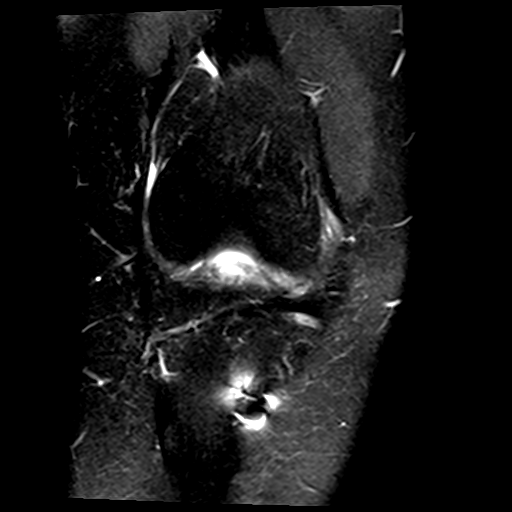
[im 10/24]
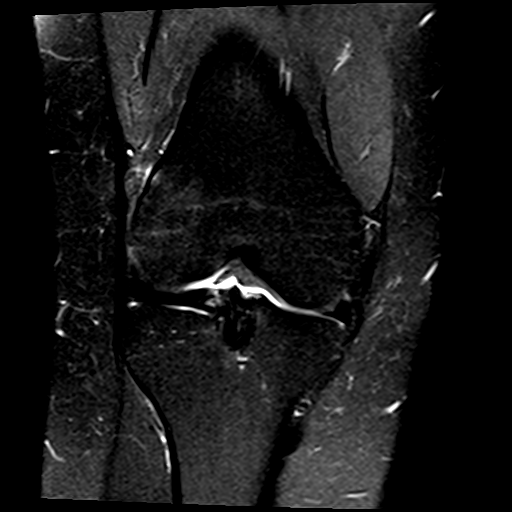
[im 14/24]
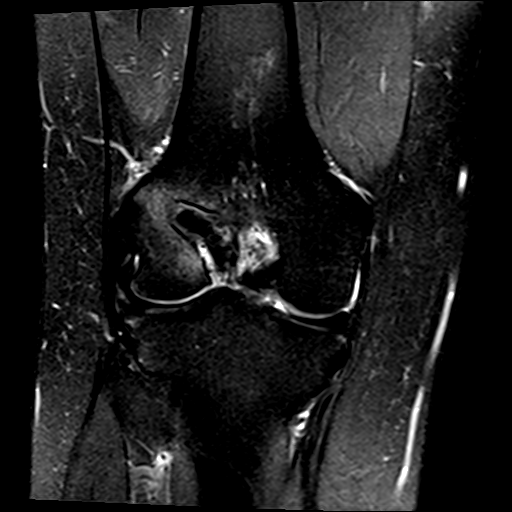
[im 17/24]
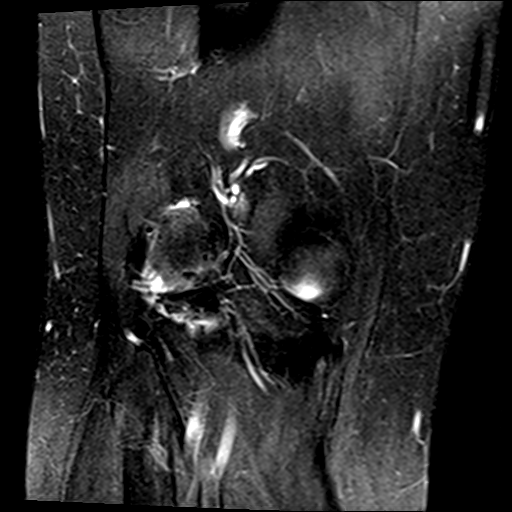
[im 20/24]
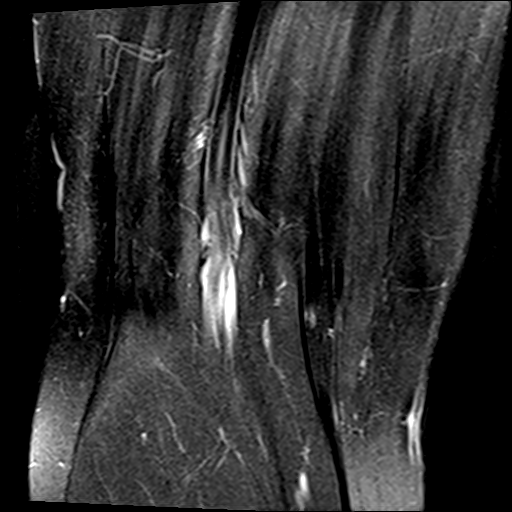
[im 24/24]
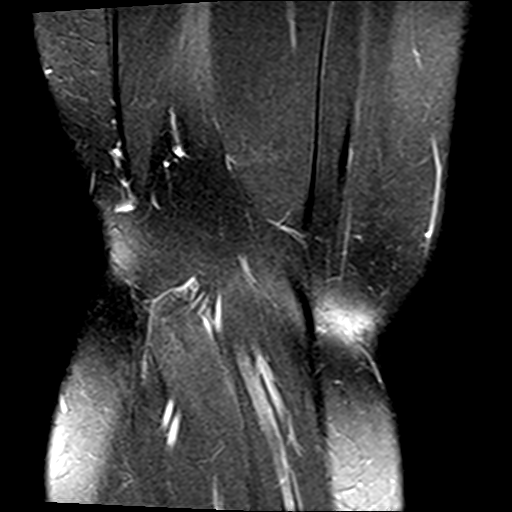

[Series 6: PD fat-sat · sagittal · 4.0mm · 0.50mm/px · 6 of 19 slices shown (3 of 3)]
[im 1/19]
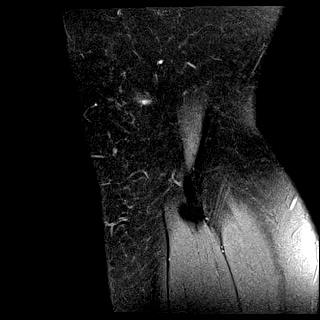
[im 4/19]
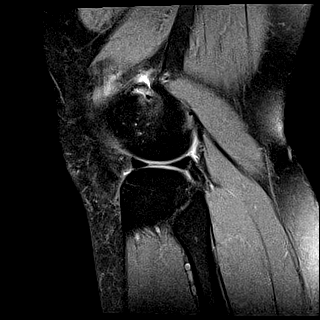
[im 8/19]
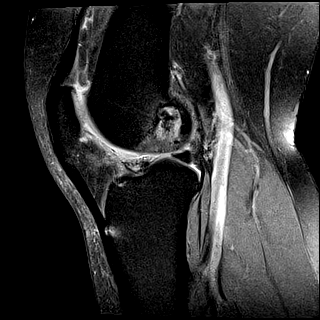
[im 11/19]
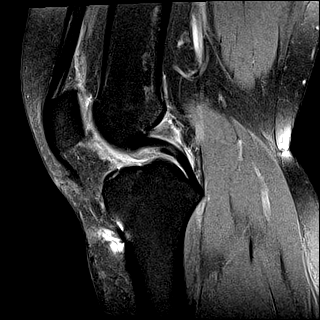
[im 15/19]
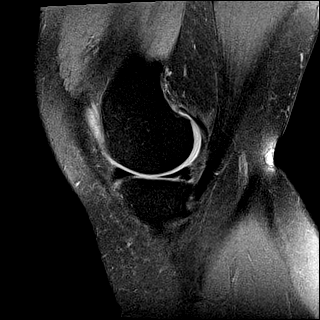
[im 19/19]
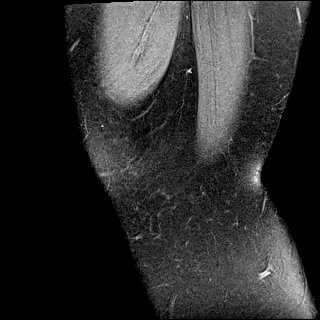

[Series 7: T1 · coronal · 4.0mm · 0.25mm/px · 2 of 24 slices shown]
[im 1/24]
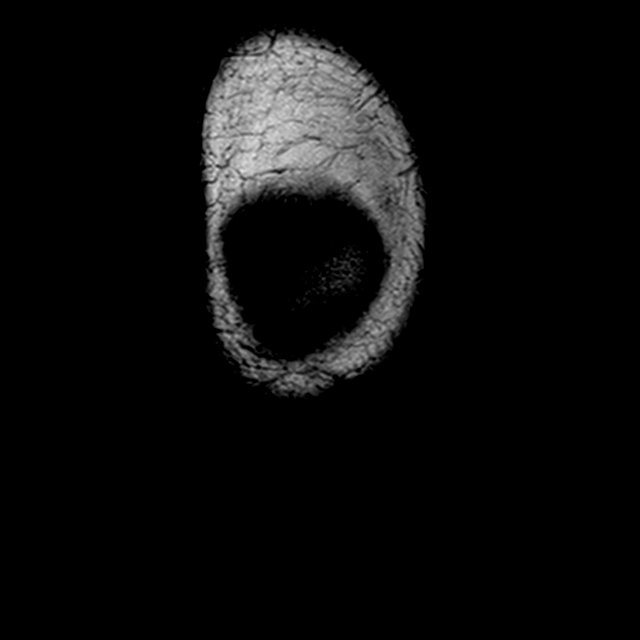
[im 4/24]
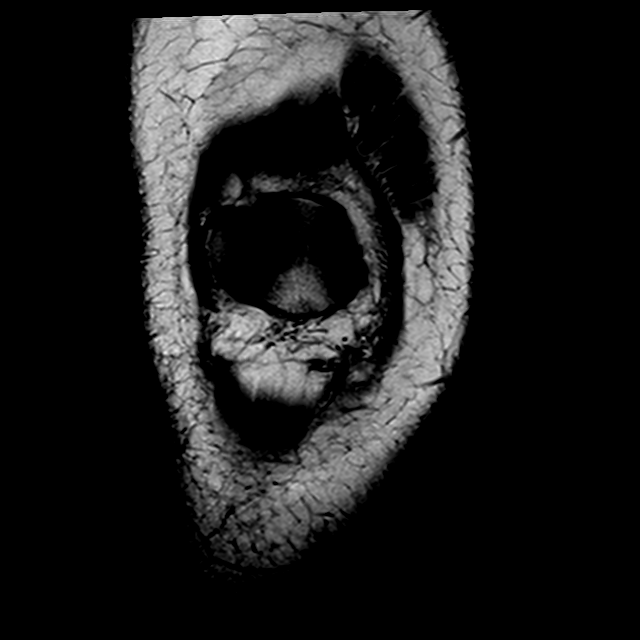

[31 of 40 positions shown; findings below may reference images not displayed]

FINDINGS: MENISCI

Medial meniscus: There is minimal undersurface irregularity of the
posterior horn which appears new, best seen on sagittal images 5 and
6. This is equivocal for a small meniscal tear. There is no
displaced meniscal fragment. The meniscal root is intact.

Lateral meniscus:  Intact with normal morphology.

LIGAMENTS

Cruciates: Interval ACL reconstruction. The ACL graft is intact. The
PCL appears normal.

Collaterals: Intact. There is stable minimal thickening of the MCL.

CARTILAGE

Patellofemoral:  Preserved.

Medial:  Preserved.

Lateral:  Preserved.

OTHER

Joint: No significant joint effusion. There is scattered
intra-articular susceptibility artifact related to the interval
surgery.

Popliteal Fossa:  Unremarkable. No significant Baker's cyst.

Extensor Mechanism: Intact. There is mild edema centrally in Hoffa's
fat.

Bones: No acute or significant extra-articular osseous findings. The
previously demonstrated posterior tibial bone contusions have
healed.
IMPRESSION: 1. Intact ACL graft status post interval ACL reconstruction.
2. Mild edema in Hoffa's fat could reflect mild synovitis. No
significant arthrofibrosis.
3. Minimal undersurface irregularity of the posterior horn of the
medial meniscus, equivocal for a small meniscal tear. No displaced
meniscal fragment. The lateral meniscus appears normal.

## 2017-08-03 ENCOUNTER — Telehealth (INDEPENDENT_AMBULATORY_CARE_PROVIDER_SITE_OTHER): Payer: Self-pay | Admitting: Orthopedic Surgery

## 2017-08-03 NOTE — Telephone Encounter (Signed)
Patient called asked if Dr August Saucerean will write a note for her stating she was released from his care. Patient said she is trying to join the the [employer]. Patient advised her address is [address] [place]  [id number]   The number to contact patient is [phone]

## 2017-08-06 NOTE — Telephone Encounter (Signed)
Mailed. LM for patient advising done.

## 2017-08-06 NOTE — Telephone Encounter (Signed)
Please advise. Patient last seen 08/2016.

## 2017-08-06 NOTE — Telephone Encounter (Signed)
Ok for release of care note

## 2017-08-09 ENCOUNTER — Telehealth (INDEPENDENT_AMBULATORY_CARE_PROVIDER_SITE_OTHER): Payer: Self-pay | Admitting: Orthopedic Surgery

## 2017-08-09 NOTE — Telephone Encounter (Signed)
Tried calling patient back. No answer. LMVM for her advising (as stated in previous message) note has already been generated. She could have picked up copy when she came by the office today. I did however, mail copy to address provided in message from the other day.

## 2017-08-09 NOTE — Telephone Encounter (Signed)
Patient came into the clinic requesting a letter from Dr. August Saucerean stating that she is cleared to join the [employer].  CB#[id number].  Thank you.
